# Patient Record
Sex: Female | Born: 1945 | Race: Black or African American | Hispanic: No | Marital: Married
Health system: Southern US, Community
[De-identification: ages and names within clinical notes are randomized; demographics above are authoritative.]

## PROBLEM LIST (undated history)

## (undated) DIAGNOSIS — E119 Type 2 diabetes mellitus without complications: Secondary | ICD-10-CM

## (undated) DIAGNOSIS — I10 Essential (primary) hypertension: Secondary | ICD-10-CM

---

## 1999-03-02 ENCOUNTER — Other Ambulatory Visit: Admission: RE | Admit: 1999-03-02 | Discharge: 1999-03-02 | Payer: Self-pay | Admitting: Obstetrics and Gynecology

## 2000-08-10 ENCOUNTER — Other Ambulatory Visit: Admission: RE | Admit: 2000-08-10 | Discharge: 2000-08-10 | Payer: Self-pay | Admitting: Family Medicine

## 2006-05-07 ENCOUNTER — Emergency Department (HOSPITAL_COMMUNITY): Admission: EM | Admit: 2006-05-07 | Discharge: 2006-05-07 | Payer: Self-pay | Admitting: Emergency Medicine

## 2008-12-10 HISTORY — PX: COLONOSCOPY: SHX174

## 2009-12-02 ENCOUNTER — Inpatient Hospital Stay (HOSPITAL_COMMUNITY)
Admission: EM | Admit: 2009-12-02 | Discharge: 2009-12-04 | Payer: Self-pay | Source: Home / Self Care | Admitting: Emergency Medicine

## 2010-02-10 ENCOUNTER — Other Ambulatory Visit (HOSPITAL_COMMUNITY): Payer: Self-pay

## 2010-03-15 ENCOUNTER — Other Ambulatory Visit (HOSPITAL_COMMUNITY): Payer: Self-pay

## 2010-03-23 LAB — CBC
HCT: 26.7 % — ABNORMAL LOW (ref 36.0–46.0)
HCT: 27 % — ABNORMAL LOW (ref 36.0–46.0)
HCT: 28.8 % — ABNORMAL LOW (ref 36.0–46.0)
Hemoglobin: 8.4 g/dL — ABNORMAL LOW (ref 12.0–15.0)
Hemoglobin: 8.7 g/dL — ABNORMAL LOW (ref 12.0–15.0)
Hemoglobin: 8.7 g/dL — ABNORMAL LOW (ref 12.0–15.0)
MCH: 25.4 pg — ABNORMAL LOW (ref 26.0–34.0)
MCH: 25.8 pg — ABNORMAL LOW (ref 26.0–34.0)
MCH: 26 pg (ref 26.0–34.0)
MCH: 26 pg (ref 26.0–34.0)
MCH: 26.1 pg (ref 26.0–34.0)
MCH: 26.3 pg (ref 26.0–34.0)
MCHC: 32.6 g/dL (ref 30.0–36.0)
MCHC: 33.1 g/dL (ref 30.0–36.0)
MCHC: 33.1 g/dL (ref 30.0–36.0)
MCV: 78.7 fL (ref 78.0–100.0)
MCV: 78.8 fL (ref 78.0–100.0)
MCV: 78.8 fL (ref 78.0–100.0)
Platelets: 176 10*3/uL (ref 150–400)
Platelets: 188 10*3/uL (ref 150–400)
Platelets: 190 10*3/uL (ref 150–400)
Platelets: 201 10*3/uL (ref 150–400)
RBC: 3.22 MIL/uL — ABNORMAL LOW (ref 3.87–5.11)
RBC: 3.42 MIL/uL — ABNORMAL LOW (ref 3.87–5.11)
RDW: 15.2 % (ref 11.5–15.5)
RDW: 15.6 % — ABNORMAL HIGH (ref 11.5–15.5)
RDW: 15.6 % — ABNORMAL HIGH (ref 11.5–15.5)
RDW: 15.7 % — ABNORMAL HIGH (ref 11.5–15.5)
RDW: 15.7 % — ABNORMAL HIGH (ref 11.5–15.5)
RDW: 15.9 % — ABNORMAL HIGH (ref 11.5–15.5)
WBC: 6.8 10*3/uL (ref 4.0–10.5)
WBC: 7.1 10*3/uL (ref 4.0–10.5)
WBC: 9.1 10*3/uL (ref 4.0–10.5)
WBC: 9.6 10*3/uL (ref 4.0–10.5)

## 2010-03-23 LAB — GLUCOSE, CAPILLARY
Glucose-Capillary: 114 mg/dL — ABNORMAL HIGH (ref 70–99)
Glucose-Capillary: 128 mg/dL — ABNORMAL HIGH (ref 70–99)
Glucose-Capillary: 136 mg/dL — ABNORMAL HIGH (ref 70–99)
Glucose-Capillary: 138 mg/dL — ABNORMAL HIGH (ref 70–99)
Glucose-Capillary: 138 mg/dL — ABNORMAL HIGH (ref 70–99)
Glucose-Capillary: 90 mg/dL (ref 70–99)

## 2010-03-23 LAB — COMPREHENSIVE METABOLIC PANEL
ALT: 17 U/L (ref 0–35)
AST: 18 U/L (ref 0–37)
Albumin: 3.1 g/dL — ABNORMAL LOW (ref 3.5–5.2)
Alkaline Phosphatase: 55 U/L (ref 39–117)
Alkaline Phosphatase: 69 U/L (ref 39–117)
BUN: 9 mg/dL (ref 6–23)
Chloride: 110 mEq/L (ref 96–112)
GFR calc non Af Amer: 41 mL/min — ABNORMAL LOW (ref >60)
GFR calc non Af Amer: 53 mL/min — ABNORMAL LOW (ref >60)
Glucose, Bld: 125 mg/dL — ABNORMAL HIGH (ref 70–99)
Glucose, Bld: 227 mg/dL — ABNORMAL HIGH (ref 70–99)
Potassium: 4 mEq/L (ref 3.5–5.1)
Potassium: 4 mEq/L (ref 3.5–5.1)
Sodium: 139 mEq/L (ref 135–145)
Total Bilirubin: 0.4 mg/dL (ref 0.3–1.2)
Total Protein: 5.7 g/dL — ABNORMAL LOW (ref 6.0–8.3)

## 2010-03-23 LAB — DIFFERENTIAL
Basophils Absolute: 0 10*3/uL (ref 0.0–0.1)
Basophils Absolute: 0 10*3/uL (ref 0.0–0.1)
Basophils Absolute: 0 10*3/uL (ref 0.0–0.1)
Basophils Absolute: 0.1 10*3/uL (ref 0.0–0.1)
Basophils Relative: 0 % (ref 0–1)
Basophils Relative: 1 % (ref 0–1)
Basophils Relative: 1 % (ref 0–1)
Eosinophils Absolute: 0.1 10*3/uL (ref 0.0–0.7)
Eosinophils Absolute: 0.1 10*3/uL (ref 0.0–0.7)
Eosinophils Absolute: 0.1 10*3/uL (ref 0.0–0.7)
Eosinophils Absolute: 0.2 10*3/uL (ref 0.0–0.7)
Eosinophils Relative: 2 % (ref 0–5)
Lymphocytes Relative: 37 % (ref 12–46)
Lymphocytes Relative: 40 % (ref 12–46)
Lymphs Abs: 2.5 10*3/uL (ref 0.7–4.0)
Lymphs Abs: 2.7 10*3/uL (ref 0.7–4.0)
Lymphs Abs: 3.3 10*3/uL (ref 0.7–4.0)
Monocytes Absolute: 0.4 10*3/uL (ref 0.1–1.0)
Monocytes Relative: 6 % (ref 3–12)
Monocytes Relative: 8 % (ref 3–12)
Neutro Abs: 3.1 10*3/uL (ref 1.7–7.7)
Neutro Abs: 3.3 10*3/uL (ref 1.7–7.7)
Neutro Abs: 5 10*3/uL (ref 1.7–7.7)
Neutrophils Relative %: 46 % (ref 43–77)
Neutrophils Relative %: 47 % (ref 43–77)
Neutrophils Relative %: 49 % (ref 43–77)
Neutrophils Relative %: 52 % (ref 43–77)

## 2010-03-23 LAB — URINE CULTURE

## 2010-03-23 LAB — ABO/RH: ABO/RH(D): O POS

## 2010-03-23 LAB — APTT: aPTT: 21 seconds — ABNORMAL LOW (ref 24–37)

## 2010-03-23 LAB — LACTIC ACID, PLASMA: Lactic Acid, Venous: 3 mmol/L — ABNORMAL HIGH (ref 0.5–2.2)

## 2010-03-23 LAB — PROTIME-INR
INR: 1.06 (ref 0.00–1.49)
Prothrombin Time: 14 seconds (ref 11.6–15.2)

## 2010-03-23 LAB — TYPE AND SCREEN: Unit division: 0

## 2010-03-23 LAB — URINALYSIS, ROUTINE W REFLEX MICROSCOPIC
Specific Gravity, Urine: 1.028 (ref 1.005–1.030)
Urobilinogen, UA: 0.2 mg/dL (ref 0.0–1.0)
pH: 5 (ref 5.0–8.0)

## 2010-03-23 LAB — LIPASE, BLOOD: Lipase: 19 U/L (ref 11–59)

## 2010-03-23 LAB — URINE MICROSCOPIC-ADD ON

## 2010-03-23 LAB — CARDIAC PANEL(CRET KIN+CKTOT+MB+TROPI): Troponin I: 0.02 ng/mL (ref 0.00–0.06)

## 2010-03-23 LAB — POCT CARDIAC MARKERS: Myoglobin, poc: 41.7 ng/mL (ref 12–200)

## 2010-04-13 ENCOUNTER — Other Ambulatory Visit (HOSPITAL_COMMUNITY): Payer: Self-pay

## 2010-04-15 ENCOUNTER — Other Ambulatory Visit: Payer: Self-pay | Admitting: Gastroenterology

## 2010-04-15 ENCOUNTER — Ambulatory Visit (HOSPITAL_COMMUNITY)
Admission: RE | Admit: 2010-04-15 | Discharge: 2010-04-15 | Disposition: A | Discharge status: Home / Self Care | Payer: PRIVATE HEALTH INSURANCE | Source: Ambulatory Visit | Attending: Gastroenterology | Admitting: Gastroenterology

## 2010-04-15 DIAGNOSIS — K648 Other hemorrhoids: Secondary | ICD-10-CM | POA: Insufficient documentation

## 2010-04-15 DIAGNOSIS — Z79899 Other long term (current) drug therapy: Secondary | ICD-10-CM | POA: Insufficient documentation

## 2010-04-15 DIAGNOSIS — K269 Duodenal ulcer, unspecified as acute or chronic, without hemorrhage or perforation: Secondary | ICD-10-CM | POA: Insufficient documentation

## 2010-04-15 DIAGNOSIS — K620 Anal polyp: Secondary | ICD-10-CM | POA: Insufficient documentation

## 2010-04-15 DIAGNOSIS — I10 Essential (primary) hypertension: Secondary | ICD-10-CM | POA: Insufficient documentation

## 2010-04-15 DIAGNOSIS — K315 Obstruction of duodenum: Secondary | ICD-10-CM | POA: Insufficient documentation

## 2010-04-15 DIAGNOSIS — K644 Residual hemorrhoidal skin tags: Secondary | ICD-10-CM | POA: Insufficient documentation

## 2010-04-15 DIAGNOSIS — D126 Benign neoplasm of colon, unspecified: Secondary | ICD-10-CM | POA: Insufficient documentation

## 2010-04-15 DIAGNOSIS — E109 Type 1 diabetes mellitus without complications: Secondary | ICD-10-CM | POA: Insufficient documentation

## 2010-04-15 DIAGNOSIS — D649 Anemia, unspecified: Secondary | ICD-10-CM | POA: Insufficient documentation

## 2010-04-15 DIAGNOSIS — K208 Other esophagitis without bleeding: Secondary | ICD-10-CM | POA: Insufficient documentation

## 2010-04-15 DIAGNOSIS — K621 Rectal polyp: Secondary | ICD-10-CM | POA: Insufficient documentation

## 2010-04-26 NOTE — Op Note (Signed)
NAME:  Madison Edwards, Madison Edwards NO.:  [id number]  MEDICAL RECORD NO.:  [id number]           PATIENT TYPE:  O  LOCATION:  WLEN                         FACILITY:  [facility]  PHYSICIAN:  Petra Kuba, M.D.    DATE OF BIRTH:  01/17/1945  DATE OF PROCEDURE:  04/15/2010 DATE OF DISCHARGE:                              OPERATIVE REPORT   PROCEDURE:  Esophagogastroduodenoscopy.  INDICATION:  Upper tract symptoms, anemia.  Consent was signed after risks, benefits, methods, options thoroughly discussed multiple times in the past.  MEDICINES USED:  Per Anesthesia, 80 mg Diprivan, 50 mcg of fentanyl, 2 mg of Versed.  PROCEDURE:  Video endoscope was inserted by direct vision.  Proximal and mid-esophagus was normal.  She may have had some very minimal distal esophagitis, but no obvious significant hiatal hernia.  Scope passed into the stomach, advanced through a normal antrum, normal pylorus into the duodenal bulb where a well-healed small ulcer was seen without a white base which just looked like a residual diverticula.  Scope passed around the C-loop to a normal second portion of the duodenum.  A quick look at the ampulla was normal.  Scope was slowly withdrawn back to the bulb and a good look confirmed the above findings.  Scope was withdrawn back to the stomach.  The cardia and the fundus were normal.  She had some difficulty holding air, so complete retroflexion view was difficult, but straight visualization of the stomach did not reveal any additional findings.  Air was suctioned.  Scope slowly withdrawn. Again, a good look at the esophagus confirmed a very minimal distal reflux esophagitis, but no other abnormalities.  Scope was removed.  The patient tolerated the procedure well.  There was no obvious immediate complication.  ENDOSCOPIC DIAGNOSES: 1. Scar from previous duodenal bulb ulcer. 2. Otherwise normal EGD except for very minimal distal esophagitis.  PLAN:  Continue  pump inhibitors.  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          ______________________________ Petra Kuba, M.D.     MEM/MEDQ  D:  04/15/2010  T:  04/16/2010  Job:  [id number]  cc:   Larina Earthly, M.D. Fax: [phone]  Electronically Signed by Vida Rigger M.D. on 04/26/2010 12:47:07 PM

## 2010-04-26 NOTE — Op Note (Signed)
NAME:  Madison Edwards, Madison Edwards NO.:  [id number]  MEDICAL RECORD NO.:  [id number]           PATIENT TYPE:  O  LOCATION:  WLEN                         FACILITY:  [facility]  PHYSICIAN:  Petra Kuba, M.D.    DATE OF BIRTH:  [birth year]  DATE OF PROCEDURE:  04/15/2010 DATE OF DISCHARGE:                              OPERATIVE REPORT   PROCEDURE:  Colonoscopy with polypectomy and biopsy.  INDICATION:  The patient with large polyp with high-grade dysplasia, want to make sure all was removed in a patient with chronic anemia. Consent was signed after risks, benefits, methods, options thoroughly discussed multiple times in the past.  ADDITIONAL MEDICINES:  For this procedure, 400 mg of Diprivan only.  PROCEDURE:  Rectal inspection was pertinent for external hemorrhoids, small.  Digital exam was negative.  The video colonoscope was inserted and easily advanced around the colon to the cecum, to advance into the cecal pole required some abdominal pressure.  On insertion, the [place] ink injection site was confirmed without obvious significant mass seen on insertion and no other worrisome abnormalities seen on insertion. The cecum was identified by the appendiceal orifice and the ileocecal valve.  The prep was adequate.  There was some liquid stool that required washing and suctioning.  The scope was slowly withdrawn.  In the proximal ascending, a small polyp was seen and to get it in proper position for polypectomy, we did have to roll her onto her back and the polyp was snared, electrocautery applied, and the polyp was suctioned through the scope and collected in the trap.  The scope was then further withdrawn.  No other abnormalities were seen as we slowly withdrew back to the splenic flexure where the [place] ink was seen.  Along the cautery margin next to the [place] ink, possibly a tiny amount of residual polyp was seen and multiple cold biopsies were done of the area and put in  a separate container.  No other obvious residual abnormality was seen.  We went ahead and slowly withdrew.  The descending and proximal sigmoid was normal.  In the distal sigmoid and rectum, a few hyperplastic-appearing polyps were seen and were hot biopsied and put in a 3rd container. Anorectal pull-through and retroflexion confirmed some small hemorrhoids.  Scope was straightened and readvanced a short ways up the left side of the colon back to the [place] ink area and again this area was one more time reevaluated without additional findings.  The air was suctioned.  Scope withdrawn.  The patient tolerated this procedure well. There was no obvious immediate complication.  ENDOSCOPIC DIAGNOSES: 1. Internal and external small hemorrhoids. 2. A few tiny rectal and distal sigmoid hyperplastic-appearing polyps,     hot biopsied. 3. [place] ink at the proximal level of splenic flexure with a     questionable very minimal residual polyp along the cautery margin     status post biopsy. 4. Ascending small polyp, snared. 5. Otherwise, within normal limits to the cecum.  PLAN:  Await pathology, but probably repeat colon screening in 1-3 years unless worrisome finding.  Continue present management.  Continue  iron. Continue pump inhibitors and follow up p.r.n. or in 2 months to recheck symptoms, possibly guaiacs and CBC and make sure no further workup plans are needed.          ______________________________ Petra Kuba, M.D.     MEM/MEDQ  D:  04/15/2010  T:  04/16/2010  Job:  [id number]  cc:   Larina Earthly, M.D. Fax: [phone]  Electronically Signed by Vida Rigger M.D. on 04/26/2010 12:47:10 PM

## 2012-02-22 ENCOUNTER — Ambulatory Visit: Payer: PRIVATE HEALTH INSURANCE | Admitting: Rehabilitative and Restorative Service Providers"

## 2012-02-24 ENCOUNTER — Ambulatory Visit: Payer: PRIVATE HEALTH INSURANCE | Attending: Internal Medicine | Admitting: Physical Therapy

## 2013-05-27 ENCOUNTER — Other Ambulatory Visit: Payer: Self-pay | Admitting: Internal Medicine

## 2013-05-27 DIAGNOSIS — Z1231 Encounter for screening mammogram for malignant neoplasm of breast: Secondary | ICD-10-CM

## 2013-06-06 ENCOUNTER — Inpatient Hospital Stay: Admission: RE | Admit: 2013-06-06 | Payer: PRIVATE HEALTH INSURANCE | Source: Ambulatory Visit

## 2014-04-19 ENCOUNTER — Emergency Department (HOSPITAL_COMMUNITY): Payer: Medicare Other

## 2014-04-19 ENCOUNTER — Encounter (HOSPITAL_COMMUNITY): Payer: Self-pay | Admitting: Emergency Medicine

## 2014-04-19 ENCOUNTER — Emergency Department (HOSPITAL_COMMUNITY)
Admission: EM | Admit: 2014-04-19 | Discharge: 2014-04-19 | Disposition: A | Discharge status: Home / Self Care | Payer: Medicare Other | Attending: Emergency Medicine | Admitting: Emergency Medicine

## 2014-04-19 DIAGNOSIS — I10 Essential (primary) hypertension: Secondary | ICD-10-CM | POA: Insufficient documentation

## 2014-04-19 DIAGNOSIS — R1084 Generalized abdominal pain: Secondary | ICD-10-CM | POA: Diagnosis not present

## 2014-04-19 DIAGNOSIS — E119 Type 2 diabetes mellitus without complications: Secondary | ICD-10-CM | POA: Insufficient documentation

## 2014-04-19 DIAGNOSIS — R109 Unspecified abdominal pain: Secondary | ICD-10-CM

## 2014-04-19 DIAGNOSIS — R112 Nausea with vomiting, unspecified: Secondary | ICD-10-CM | POA: Diagnosis not present

## 2014-04-19 HISTORY — DX: Essential (primary) hypertension: I10

## 2014-04-19 HISTORY — DX: Type 2 diabetes mellitus without complications: E11.9

## 2014-04-19 LAB — COMPREHENSIVE METABOLIC PANEL
ALBUMIN: 4.1 g/dL (ref 3.5–5.2)
ALT: 21 U/L (ref 0–35)
ANION GAP: 11 (ref 5–15)
AST: 20 U/L (ref 0–37)
Alkaline Phosphatase: 99 U/L (ref 39–117)
BUN: 22 mg/dL (ref 6–23)
CHLORIDE: 99 mmol/L (ref 96–112)
CO2: 26 mmol/L (ref 19–32)
CREATININE: 1.23 mg/dL — AB (ref 0.50–1.10)
Calcium: 9.7 mg/dL (ref 8.4–10.5)
GFR calc non Af Amer: 44 mL/min — ABNORMAL LOW (ref >90)
GFR, EST AFRICAN AMERICAN: 51 mL/min — AB (ref >90)
Glucose, Bld: 196 mg/dL — ABNORMAL HIGH (ref 70–99)
POTASSIUM: 4.1 mmol/L (ref 3.5–5.1)
Sodium: 136 mmol/L (ref 135–145)
Total Bilirubin: 0.7 mg/dL (ref 0.3–1.2)
Total Protein: 7.8 g/dL (ref 6.0–8.3)

## 2014-04-19 LAB — CBC WITH DIFFERENTIAL/PLATELET
Basophils Absolute: 0.1 10*3/uL (ref 0.0–0.1)
Basophils Relative: 1 % (ref 0–1)
EOS ABS: 0.1 10*3/uL (ref 0.0–0.7)
Eosinophils Relative: 2 % (ref 0–5)
HEMATOCRIT: 41.5 % (ref 36.0–46.0)
HEMOGLOBIN: 12.9 g/dL (ref 12.0–15.0)
LYMPHS ABS: 2.7 10*3/uL (ref 0.7–4.0)
Lymphocytes Relative: 40 % (ref 12–46)
MCH: 24.6 pg — AB (ref 26.0–34.0)
MCHC: 31.1 g/dL (ref 30.0–36.0)
MCV: 79 fL (ref 78.0–100.0)
MONOS PCT: 7 % (ref 3–12)
Monocytes Absolute: 0.5 10*3/uL (ref 0.1–1.0)
Neutro Abs: 3.3 10*3/uL (ref 1.7–7.7)
Neutrophils Relative %: 50 % (ref 43–77)
PLATELETS: 253 10*3/uL (ref 150–400)
RBC: 5.25 MIL/uL — ABNORMAL HIGH (ref 3.87–5.11)
RDW: 15 % (ref 11.5–15.5)
WBC: 6.7 10*3/uL (ref 4.0–10.5)

## 2014-04-19 LAB — URINALYSIS, ROUTINE W REFLEX MICROSCOPIC
Bilirubin Urine: NEGATIVE
Glucose, UA: NEGATIVE mg/dL
Hgb urine dipstick: NEGATIVE
KETONES UR: NEGATIVE mg/dL
Nitrite: NEGATIVE
PROTEIN: 100 mg/dL — AB
Specific Gravity, Urine: 1.024 (ref 1.005–1.030)
Urobilinogen, UA: 0.2 mg/dL (ref 0.0–1.0)
pH: 5 (ref 5.0–8.0)

## 2014-04-19 LAB — LIPASE, BLOOD: Lipase: 17 U/L (ref 11–59)

## 2014-04-19 LAB — URINE MICROSCOPIC-ADD ON

## 2014-04-19 MED ORDER — IOHEXOL 300 MG/ML  SOLN
100.0000 mL | Freq: Once | INTRAMUSCULAR | Status: AC | PRN
  Administered 2014-04-19: 100 mL via INTRAVENOUS

## 2014-04-19 MED ORDER — IOHEXOL 300 MG/ML  SOLN: 50.0000 mL | Freq: Once | INTRAMUSCULAR | Status: DC | PRN

## 2014-04-19 MED ORDER — IOHEXOL 300 MG/ML  SOLN
50.0000 mL | Freq: Once | INTRAMUSCULAR | Status: AC | PRN
  Administered 2014-04-19: 50 mL via ORAL

## 2014-04-19 MED ORDER — MORPHINE SULFATE 4 MG/ML IJ SOLN
4.0000 mg | Freq: Once | INTRAMUSCULAR | Status: AC
  Administered 2014-04-19: 4 mg via INTRAVENOUS
  Filled 2014-04-19: qty 1

## 2014-04-19 MED ORDER — ONDANSETRON HCL 4 MG/2ML IJ SOLN
4.0000 mg | Freq: Once | INTRAMUSCULAR | Status: AC
  Administered 2014-04-19: 4 mg via INTRAVENOUS
  Filled 2014-04-19: qty 2

## 2014-04-19 NOTE — ED Notes (Signed)
Pt c/o abdominal pain and emesis onset Tuesday night. Denies diarrhea.

## 2014-04-19 NOTE — Discharge Instructions (Signed)
Abdominal Pain °Many things can cause abdominal pain. Usually, abdominal pain is not caused by a disease and will improve without treatment. It can often be observed and treated at home. Your health care provider will do a physical exam and possibly order blood tests and X-rays to help determine the seriousness of your pain. However, in many cases, more time must pass before a clear cause of the pain can be found. Before that point, your health care provider may not know if you need more testing or further treatment. °HOME CARE INSTRUCTIONS  °Monitor your abdominal pain for any changes. The following actions may help to alleviate any discomfort you are experiencing: °· Only take over-the-counter or prescription medicines as directed by your health care provider. °· Do not take laxatives unless directed to do so by your health care provider. °· Try a clear liquid diet (broth, tea, or water) as directed by your health care provider. Slowly move to a bland diet as tolerated. °SEEK MEDICAL CARE IF: °· You have unexplained abdominal pain. °· You have abdominal pain associated with nausea or diarrhea. °· You have pain when you urinate or have a bowel movement. °· You experience abdominal pain that wakes you in the night. °· You have abdominal pain that is worsened or improved by eating food. °· You have abdominal pain that is worsened with eating fatty foods. °· You have a fever. °SEEK IMMEDIATE MEDICAL CARE IF:  °· Your pain does not go away within 2 hours. °· You keep throwing up (vomiting). °· Your pain is felt only in portions of the abdomen, such as the right side or the left lower portion of the abdomen. °· You pass bloody or black tarry stools. °MAKE SURE YOU: °· Understand these instructions.   °· Will watch your condition.   °· Will get help right away if you are not doing well or get worse.   °Document Released: 10/06/2004 Document Revised: 01/01/2013 Document Reviewed: 09/05/2012 °[employer]® Patient Information  ©2015 [employer]. This information is not intended to replace advice given to you by your health care provider. Make sure you discuss any questions you have with your health care provider. ° °Abdominal Pain, Women °Abdominal (stomach, pelvic, or belly) pain can be caused by many things. It is important to tell your doctor: °· The location of the pain. °· Does it come and go or is it present all the time? °· Are there things that start the pain (eating certain foods, exercise)? °· Are there other symptoms associated with the pain (fever, nausea, vomiting, diarrhea)? °All of this is helpful to know when trying to find the cause of the pain. °CAUSES  °· Stomach: virus or bacteria infection, or ulcer. °· Intestine: appendicitis (inflamed appendix), regional ileitis (Crohn's disease), ulcerative colitis (inflamed colon), irritable bowel syndrome, diverticulitis (inflamed diverticulum of the colon), or cancer of the stomach or intestine. °· Gallbladder disease or stones in the gallbladder. °· Kidney disease, kidney stones, or infection. °· Pancreas infection or cancer. °· Fibromyalgia (pain disorder). °· Diseases of the female organs: °¨ Uterus: fibroid (non-cancerous) tumors or infection. °¨ Fallopian tubes: infection or tubal pregnancy. °¨ Ovary: cysts or tumors. °¨ Pelvic adhesions (scar tissue). °¨ Endometriosis (uterus lining tissue growing in the pelvis and on the pelvic organs). °¨ Pelvic congestion syndrome (female organs filling up with blood just before the menstrual period). °¨ Pain with the menstrual period. °¨ Pain with ovulation (producing an egg). °¨ Pain with an IUD (intrauterine device, birth control) in the uterus. °¨   Cancer of the female organs. °· Functional pain (pain not caused by a disease, may improve without treatment). °· Psychological pain. °· Depression. °DIAGNOSIS  °Your doctor will decide the seriousness of your pain by doing an examination. °· Blood tests. °· X-rays. °· Ultrasound. °· CT  scan (computed tomography, special type of X-ray). °· MRI (magnetic resonance imaging). °· Cultures, for infection. °· Barium enema (dye inserted in the large intestine, to better view it with X-rays). °· Colonoscopy (looking in intestine with a lighted tube). °· Laparoscopy (minor surgery, looking in abdomen with a lighted tube). °· Major abdominal exploratory surgery (looking in abdomen with a large incision). °TREATMENT  °The treatment will depend on the cause of the pain.  °· Many cases can be observed and treated at home. °· Over-the-counter medicines recommended by your caregiver. °· Prescription medicine. °· Antibiotics, for infection. °· Birth control pills, for painful periods or for ovulation pain. °· Hormone treatment, for endometriosis. °· Nerve blocking injections. °· Physical therapy. °· Antidepressants. °· Counseling with a psychologist or psychiatrist. °· Minor or major surgery. °HOME CARE INSTRUCTIONS  °· Do not take laxatives, unless directed by your caregiver. °· Take over-the-counter pain medicine only if ordered by your caregiver. Do not take aspirin because it can cause an upset stomach or bleeding. °· Try a clear liquid diet (broth or water) as ordered by your caregiver. Slowly move to a bland diet, as tolerated, if the pain is related to the stomach or intestine. °· Have a thermometer and take your temperature several times a day, and record it. °· Bed rest and sleep, if it helps the pain. °· Avoid sexual intercourse, if it causes pain. °· Avoid stressful situations. °· Keep your follow-up appointments and tests, as your caregiver orders. °· If the pain does not go away with medicine or surgery, you may try: °¨ Acupuncture. °¨ Relaxation exercises (yoga, meditation). °¨ Group therapy. °¨ Counseling. °SEEK MEDICAL CARE IF:  °· You notice certain foods cause stomach pain. °· Your home care treatment is not helping your pain. °· You need stronger pain medicine. °· You want your IUD  removed. °· You feel faint or lightheaded. °· You develop nausea and vomiting. °· You develop a rash. °· You are having side effects or an allergy to your medicine. °SEEK IMMEDIATE MEDICAL CARE IF:  °· Your pain does not go away or gets worse. °· You have a fever. °· Your pain is felt only in portions of the abdomen. The right side could possibly be appendicitis. The left lower portion of the abdomen could be colitis or diverticulitis. °· You are passing blood in your stools (bright red or black tarry stools, with or without vomiting). °· You have blood in your urine. °· You develop chills, with or without a fever. °· You pass out. °MAKE SURE YOU:  °· Understand these instructions. °· Will watch your condition. °· Will get help right away if you are not doing well or get worse. °Document Released: 10/24/2006 Document Revised: 05/13/2013 Document Reviewed: 11/13/2008 °ExitCare® Patient Information ©2015 [employer]. This information is not intended to replace advice given to you by your health care provider. Make sure you discuss any questions you have with your health care provider. ° °

## 2014-04-19 NOTE — ED Provider Notes (Signed)
CSN: [id number]     Arrival date & time 04/19/14  1741 History   First MD Initiated Contact with Patient 04/19/14 1755     Chief Complaint  Patient presents with  . Abdominal Pain     (Consider location/radiation/quality/duration/timing/severity/associated sxs/prior Treatment) Patient is a 69 y.o. female presenting with abdominal pain. The history is provided by the patient.  Abdominal Pain Pain location:  Generalized Pain quality: aching   Pain radiates to:  Does not radiate Pain severity:  Mild Onset quality:  Gradual Duration:  4 days Timing:  Intermittent Progression:  Worsening Chronicity:  Recurrent Worsened by:  Nothing tried Ineffective treatments:  None tried Associated symptoms: nausea and vomiting   Associated symptoms: no chills, no cough, no diarrhea, no fever, no hematuria, no vaginal bleeding and no vaginal discharge     Past Medical History  Diagnosis Date  . Diabetes mellitus without complication   . Hypertension    History reviewed. No pertinent past surgical history. History reviewed. No pertinent family history. History  Substance Use Topics  . Smoking status: Not on file  . Smokeless tobacco: Not on file  . Alcohol Use: Not on file   OB History    No data available     Review of Systems  Constitutional: Negative for fever and chills.  Respiratory: Negative for cough.   Gastrointestinal: Positive for nausea, vomiting and abdominal pain. Negative for diarrhea.  Genitourinary: Negative for hematuria, vaginal bleeding and vaginal discharge.  All other systems reviewed and are negative.     Allergies  Review of patient's allergies indicates no known allergies.  Home Medications   Prior to Admission medications   Not on File   BP 182/76 mmHg  Pulse 80  Temp(Src) 98.5 F (36.9 C) (Oral)  Resp 18  SpO2 100% Physical Exam  Constitutional: She is oriented to person, place, and time. She appears well-developed and well-nourished.   Non-toxic appearance. No distress.  HENT:  Head: Normocephalic and atraumatic.  Eyes: Conjunctivae, EOM and lids are normal. Pupils are equal, round, and reactive to light.  Neck: Normal range of motion. Neck supple. No tracheal deviation present. No thyroid mass present.  Cardiovascular: Normal rate, regular rhythm and normal heart sounds.  Exam reveals no gallop.   No murmur heard. Pulmonary/Chest: Effort normal and breath sounds normal. No stridor. No respiratory distress. She has no decreased breath sounds. She has no wheezes. She has no rhonchi. She has no rales.  Abdominal: Soft. Normal appearance and bowel sounds are normal. She exhibits no distension. There is generalized tenderness. There is no rigidity, no rebound, no guarding and no CVA tenderness.  Musculoskeletal: Normal range of motion. She exhibits no edema or tenderness.  Neurological: She is alert and oriented to person, place, and time. She has normal strength. No cranial nerve deficit or sensory deficit. GCS eye subscore is 4. GCS verbal subscore is 5. GCS motor subscore is 6.  Skin: Skin is warm and dry. No abrasion and no rash noted.  Psychiatric: She has a normal mood and affect. Her speech is normal and behavior is normal.  Nursing note and vitals reviewed.   ED Course  Procedures (including critical care time) Labs Review Labs Reviewed  CBC WITH DIFFERENTIAL/PLATELET  COMPREHENSIVE METABOLIC PANEL  LIPASE, BLOOD  URINALYSIS, ROUTINE W REFLEX MICROSCOPIC    Imaging Review No results found.   EKG Interpretation None      MDM   Final diagnoses:  Abdominal pain    She given  IV fluids and pain meds and feels better. Abdominal CT shows no acute process. Repeat abdominal exam at time of discharge remains nonsurgical. Patient stable for discharge    Lacretia Leigh, MD 04/19/14 2045

## 2015-12-01 ENCOUNTER — Telehealth: Payer: Self-pay

## 2015-12-01 NOTE — Telephone Encounter (Signed)
wrong patient

## 2015-12-01 NOTE — Telephone Encounter (Deleted)
Patient called back to inquire about getting RX for insulin needles. Explained that MD said that she should not be taking insulin due to hypoglycemia needs to come in for evaluation or go to ED. Patient stated she would buy her own needles and she will find her a new doctor. Encouraged patient to come in for appointment or go to ED for eval she no.

## 2016-06-01 LAB — GLUCOSE, POCT (MANUAL RESULT ENTRY): POC Glucose: 209 mg/dl — AB (ref 70–99)

## 2016-10-12 IMAGING — CT CT ABD-PELV W/ CM
1 of 3 series · 14 of 32 positions shown, 19 images · IV contrast (OMNIPAQUE 300)
Comparison: 12/02/2009

CLINICAL DATA: Abdominal pain and emesis.

EXAM:
CT ABDOMEN AND PELVIS WITH CONTRAST
TECHNIQUE: Multidetector CT imaging of the abdomen and pelvis was performed
using the standard protocol following bolus administration of
intravenous contrast.
CONTRAST:  50mL OMNIPAQUE IOHEXOL 300 MG/ML SOLN, 100mL OMNIPAQUE
IOHEXOL 300 MG/ML SOLN

[Series 2: abd/pel with · axial · 0.98mm/px · z∈[+1439,+1839]mm · 14 of 90 slices shown, 19 images]
[im 5/90  soft-tissue]
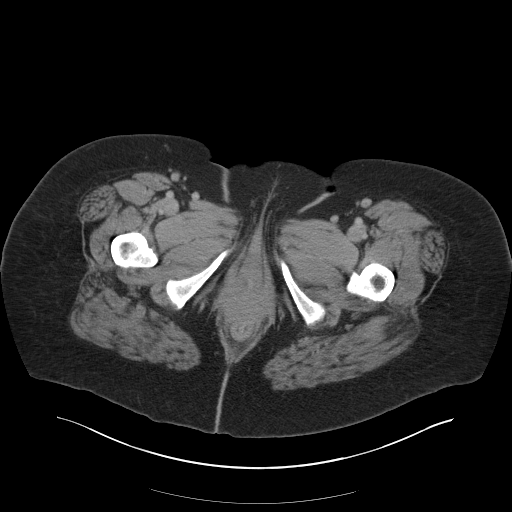
[im 5/90  bone]
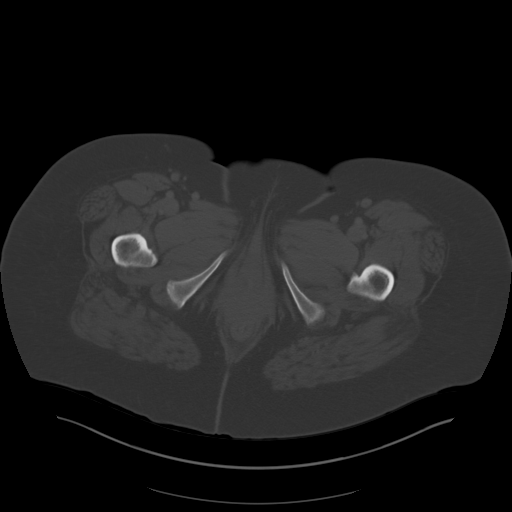
[im 15/90  soft-tissue]
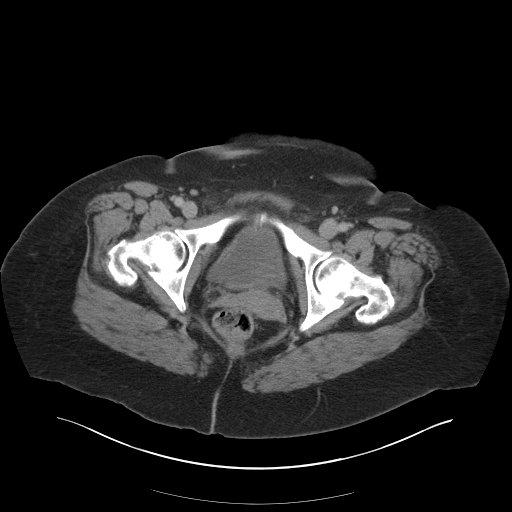
[im 19/90  soft-tissue]
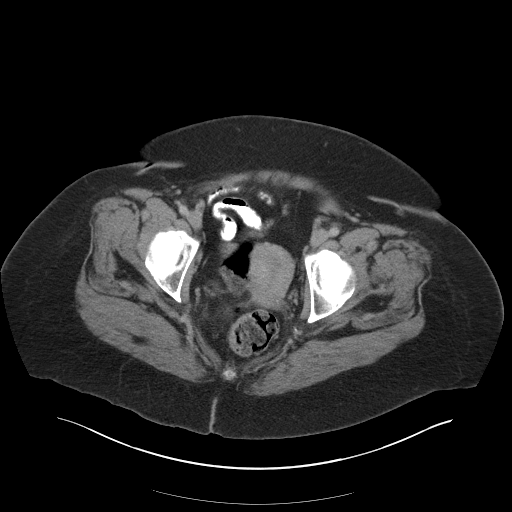
[im 24/90  soft-tissue]
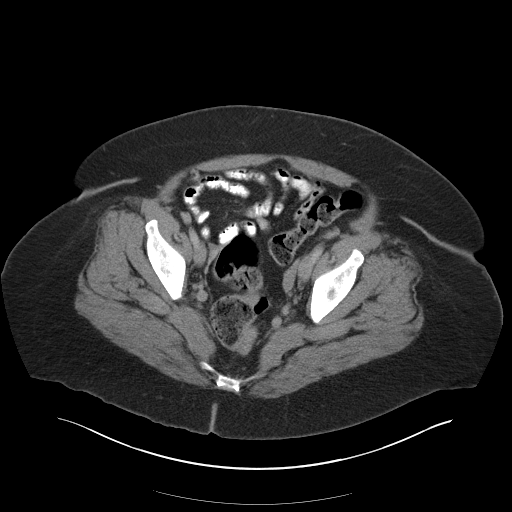
[im 33/90  soft-tissue]
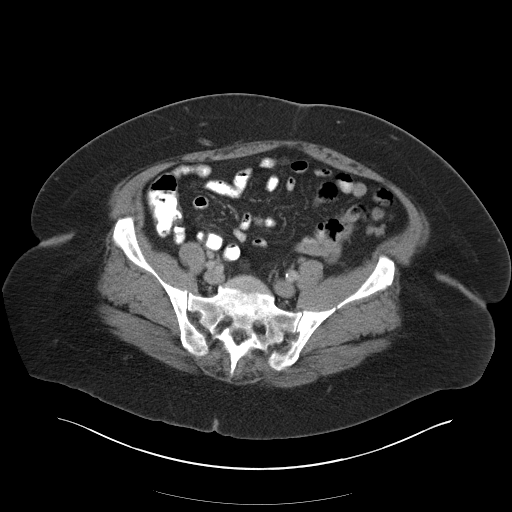
[im 38/90  soft-tissue]
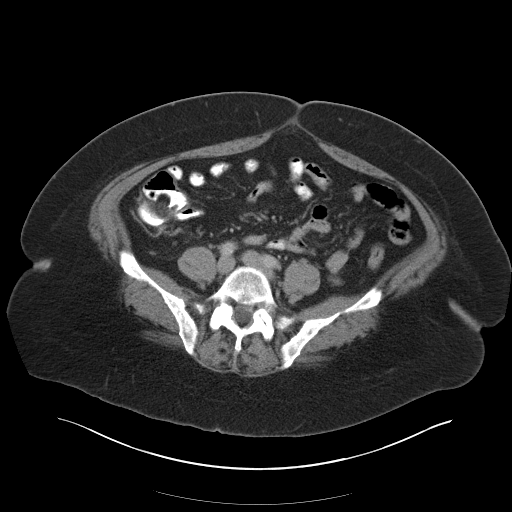
[im 47/90  soft-tissue]
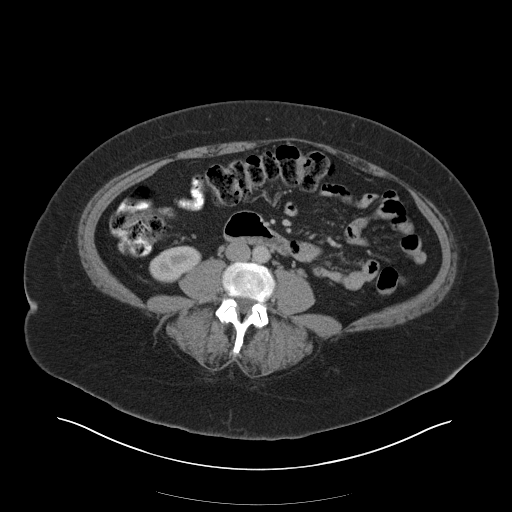
[im 52/90  soft-tissue]
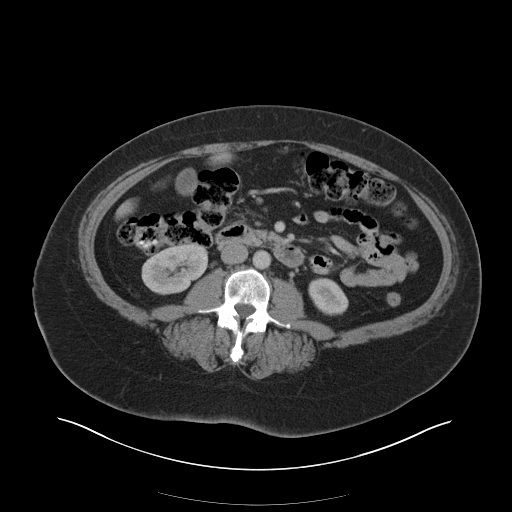
[im 57/90  soft-tissue]
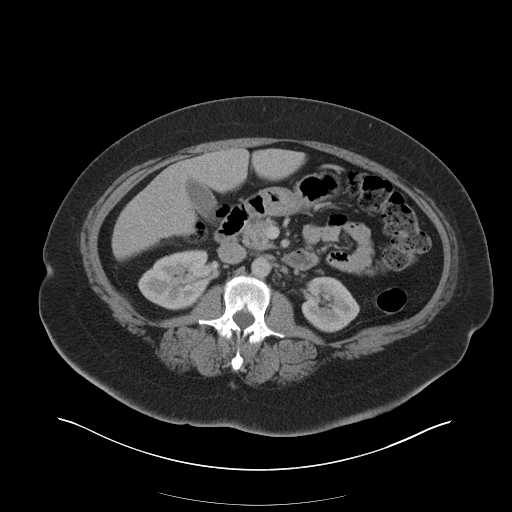
[im 57/90  bone]
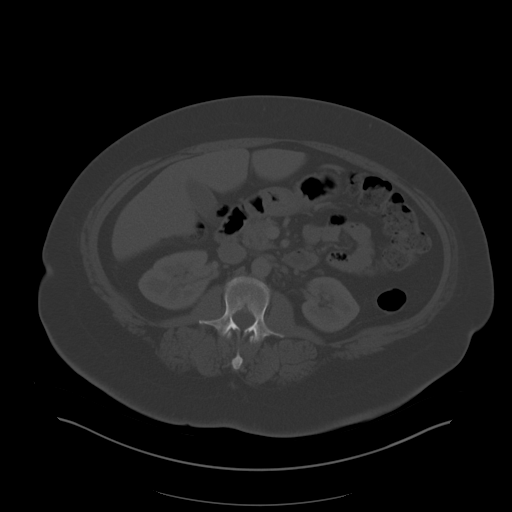
[im 66/90  soft-tissue]
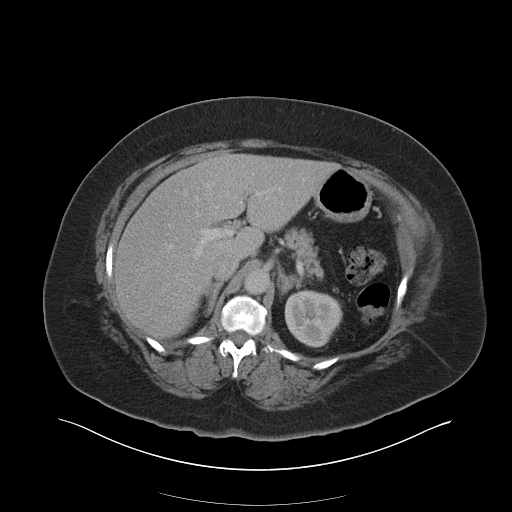
[im 71/90  soft-tissue]
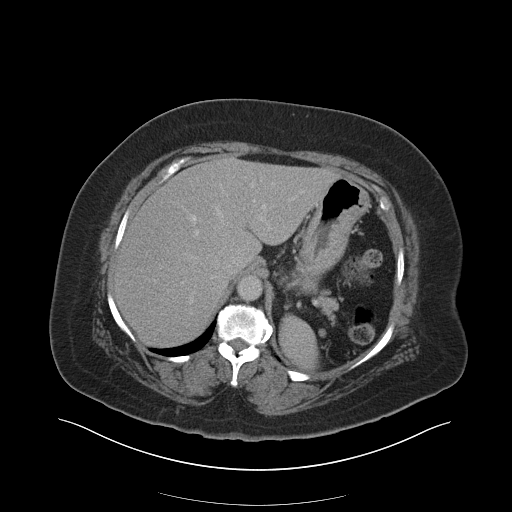
[im 71/90  lung]
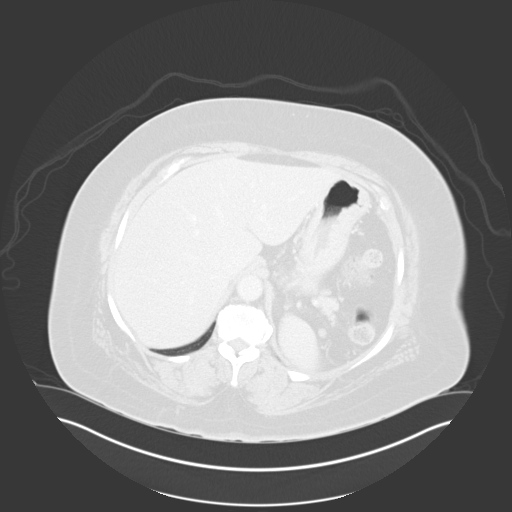
[im 75/90  soft-tissue]
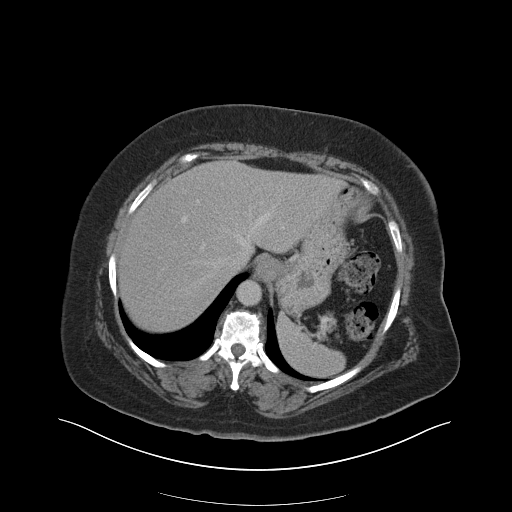
[im 75/90  lung]
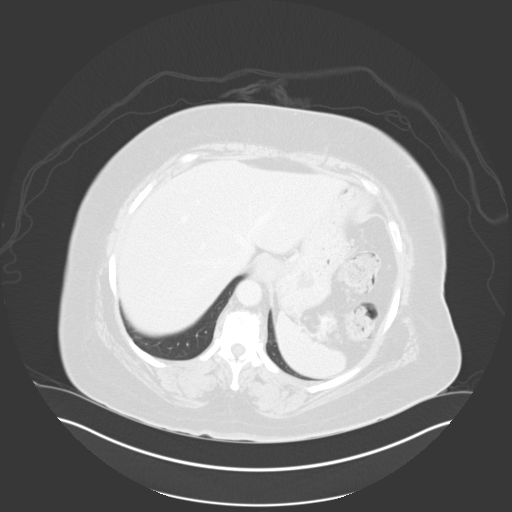
[im 80/90  lung]
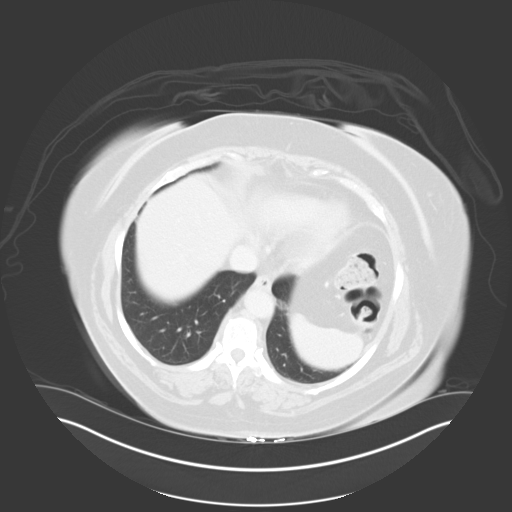
[im 85/90  soft-tissue]
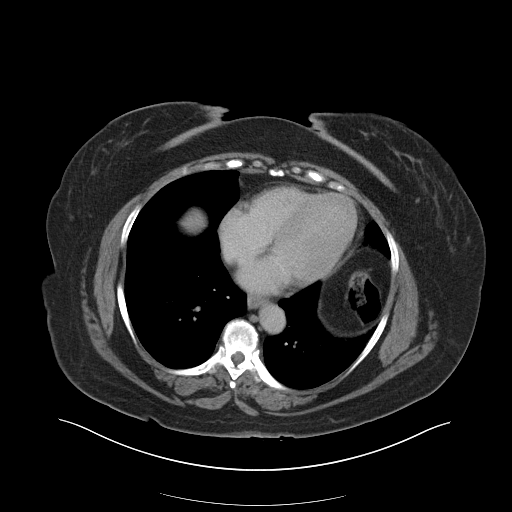
[im 85/90  lung]
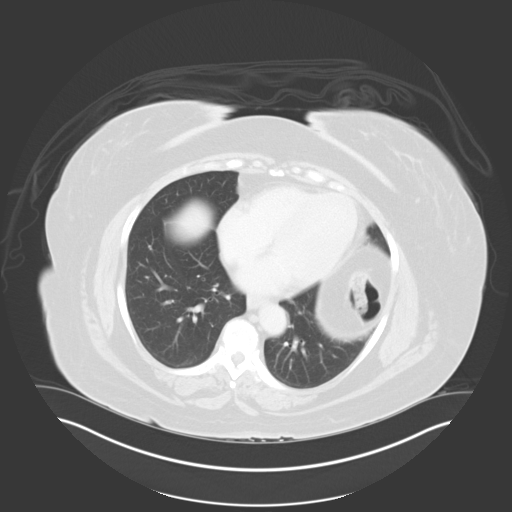

[14 of 32 positions shown; findings below may reference images not displayed]

FINDINGS: Lower chest: The lung bases appear clear. No pleural or pericardial
effusion noted.

Hepatobiliary: For calcified granuloma noted within the left lobe of
liver. The liver is otherwise normal. Gallbladder is unremarkable.
No biliary dilatation.

Pancreas: Negative

Spleen: The spleen appears normal.

Adrenals/Urinary Tract: Normal adrenal glands. The right kidney
appears normal. Unremarkable appearance of the left kidney. The
urinary bladder appears within normal limits.

Stomach/Bowel: The stomach is within normal limits. The small bowel
loops have a normal course and caliber. No obstruction. Normal
appearance of the colon. The appendix is visualized and appears
normal.

Vascular/Lymphatic: Calcified atherosclerotic disease involves the
abdominal aorta. No aneurysm. No enlarged retroperitoneal or
mesenteric adenopathy. No enlarged pelvic or inguinal lymph nodes.

Reproductive: The uterus and the adnexal structures appear within
normal limits.

Other: There is no ascites or focal fluid collections within the
abdomen or pelvis.

Musculoskeletal: Mild degenerative disc disease within the lower
thoracic spine. Bilateral lumbar facet hypertrophy and degenerative
change noted.
IMPRESSION: 1. No acute findings within the abdomen or pelvis. No explanation
for patient's abdominal pain and emesis.

## 2018-11-26 ENCOUNTER — Other Ambulatory Visit: Payer: Self-pay

## 2018-11-26 DIAGNOSIS — Z20822 Contact with and (suspected) exposure to covid-19: Secondary | ICD-10-CM

## 2018-11-28 LAB — NOVEL CORONAVIRUS, NAA: SARS-CoV-2, NAA: NOT DETECTED

## 2022-10-26 ENCOUNTER — Other Ambulatory Visit: Payer: Self-pay | Admitting: Internal Medicine

## 2022-10-26 DIAGNOSIS — N184 Chronic kidney disease, stage 4 (severe): Secondary | ICD-10-CM

## 2022-11-03 ENCOUNTER — Inpatient Hospital Stay
Admission: RE | Admit: 2022-11-03 | Discharge: 2022-11-03 | Discharge status: Home / Self Care | Payer: Medicare Other | Source: Ambulatory Visit | Attending: Internal Medicine | Admitting: Internal Medicine

## 2022-11-03 DIAGNOSIS — N184 Chronic kidney disease, stage 4 (severe): Secondary | ICD-10-CM

## 2023-05-31 ENCOUNTER — Encounter: Admitting: Family

## 2023-06-05 NOTE — Progress Notes (Signed)
 This encounter was created in error - please disregard.

## 2023-06-14 ENCOUNTER — Ambulatory Visit: Admitting: Family

## 2023-06-14 ENCOUNTER — Encounter: Payer: Self-pay | Admitting: Family

## 2023-06-14 VITALS — BP 130/70 | HR 75 | Ht 63.5 in | Wt 257.0 lb

## 2023-06-14 DIAGNOSIS — E785 Hyperlipidemia, unspecified: Secondary | ICD-10-CM

## 2023-06-14 DIAGNOSIS — J302 Other seasonal allergic rhinitis: Secondary | ICD-10-CM

## 2023-06-14 DIAGNOSIS — I10 Essential (primary) hypertension: Secondary | ICD-10-CM

## 2023-06-14 DIAGNOSIS — E2839 Other primary ovarian failure: Secondary | ICD-10-CM

## 2023-06-14 DIAGNOSIS — M25561 Pain in right knee: Secondary | ICD-10-CM

## 2023-06-14 DIAGNOSIS — M25512 Pain in left shoulder: Secondary | ICD-10-CM

## 2023-06-14 DIAGNOSIS — N184 Chronic kidney disease, stage 4 (severe): Secondary | ICD-10-CM | POA: Diagnosis not present

## 2023-06-14 DIAGNOSIS — R2681 Unsteadiness on feet: Secondary | ICD-10-CM

## 2023-06-14 DIAGNOSIS — G8929 Other chronic pain: Secondary | ICD-10-CM

## 2023-06-14 DIAGNOSIS — E1165 Type 2 diabetes mellitus with hyperglycemia: Secondary | ICD-10-CM

## 2023-06-14 DIAGNOSIS — F321 Major depressive disorder, single episode, moderate: Secondary | ICD-10-CM

## 2023-06-14 MED ORDER — FREESTYLE LIBRE 3 SENSOR MISC: 3 refills | Status: DC

## 2023-06-14 MED ORDER — TIRZEPATIDE-WEIGHT MANAGEMENT 2.5 MG/0.5ML ~~LOC~~ SOLN: 2.5000 mg | SUBCUTANEOUS | 3 refills | Status: DC

## 2023-06-14 MED ORDER — SERTRALINE HCL 25 MG PO TABS: 25.0000 mg | ORAL_TABLET | Freq: Every day | ORAL | 3 refills | Status: DC

## 2023-06-14 MED ORDER — FLUTICASONE PROPIONATE 50 MCG/ACT NA SUSP: 2.0000 | Freq: Every day | NASAL | 3 refills | Status: DC | PRN

## 2023-06-14 NOTE — Patient Instructions (Signed)
 Please get shingles vaccine and Tetanus vaccine at the Pharmacy.

## 2023-06-16 ENCOUNTER — Telehealth: Payer: Self-pay

## 2023-06-16 NOTE — Telephone Encounter (Signed)
 Incoming fax received from patients pharmacy to initiate a prior authorization for                   .  PA initiated through covermymeds. Key: [id number]  Awaiting reply from the insurance company which will be determined in 24-72 hours.

## 2023-06-18 NOTE — Progress Notes (Signed)
 Provider: Christean Courts FNP-C   Madison Edwards, Madison Guadeloupe, NP  Patient Care Team: Neely Kammerer, Madison Guadeloupe, NP as PCP - General (Family Medicine)  Extended Emergency Contact Information Primary Emergency Contact: Olene Berne Address: [address]          [place], [place] [address] [place] Home Phone: [phone] Mobile Phone: [phone] Relation: Spouse  Code Status:  Full Code  Goals of care: Advanced Directive information    04/19/2014    5:36 PM  Advanced Directives  Does Patient Have a Medical Advance Directive? No     Chief Complaint  Patient presents with   New Patient (Initial Visit)    New patient , establish care , she is has medical records from [facility]  , she stated that she hasn't felt the same since had taken Covid/Flu vaccine together about 2 years , she had  fall June of last year and injury of knee and left shoulder .  Joint pain from vaccines     Pre-visit Screening Tool Documentation    f    Discussed the use of AI scribe software for clinical note transcription with the patient, who gave verbal consent to proceed.  History of Present Illness   Madison Edwards is a 78 year old female with type 2 diabetes, chronic kidney disease stage 4, and hypertension who presents to establish care.  She has type 2 diabetes and is not currently taking Ozempic due to cost. She previously took a 0.25 mg dose without issues but stopped when the cost increased. She is interested in weight management options that also aid in diabetes control. She does not currently check her blood sugars at home due to issues with her glucometer. She had lab work done on May 24, 2023, but does not recall her A1c level.  She has chronic kidney disease stage 4 and follows up with a nephrologist every 6 to 8 weeks. She is also attending a diabetes class at the [facility].  She has hypertension and is currently taking several medications for blood pressure management,  including amlodipine 5 mg, carvedilol 25 mg twice daily, losartan with hydrochlorothiazide 100/25 mg, and doxazosin 1 mg daily at bedtime. She does not check her blood pressure at home.  She has a history of high cholesterol and is taking atorvastatin 40 mg at bedtime.  She experiences nasal congestion and headaches, particularly on the left side, and has a history of seasonal allergies. She has not used Flonase  recently due to running out of the prescription and reports needing a refill. She has allergies to dust mites and pollen, which cause sneezing and headaches. She runs two fans in her room to help with air circulation.  She reports pain in her left shoulder and right knee, which worsens at night and affects her sleep. She has had x-rays and physical therapy in the past, and sometimes uses a cane for mobility. She has not had any recent falls.  She has a history of anemia and takes a multivitamin and biotin 5 mg daily. She also takes vitamin D 50,000 units weekly and vitamin E 1,000 units daily.  She has a family history of longevity, with a brother who is 1 years old. She has three sons, one of whom has had knee surgery.  She has a history of depression, which is exacerbated by her inability to perform household tasks due to physical limitations. She experiences tingling in her left foot and occasional numbness in her  hands.  She experienced a severe reaction to a COVID booster and flu shot taken simultaneously, which resulted in prolonged illness and exacerbation of arthritis symptoms.    Past Medical History:  Diagnosis Date   Diabetes mellitus without complication (HCC)    Hypertension    Past Surgical History:  Procedure Laterality Date   COLONOSCOPY  12/2008    Allergies  Allergen Reactions   Dust Mite Extract Other (See Comments)    Sneezing and Headache.   Pollen Extract Other (See Comments)    Sneezing and Headache.     Allergies as of 06/14/2023       Reactions    Dust Mite Extract Other (See Comments)   Sneezing and Headache.   Pollen Extract Other (See Comments)   Sneezing and Headache.        Medication List        Accurate as of June 14, 2023 11:59 PM. If you have any questions, ask your nurse or doctor.          STOP taking these medications    bisoprolol-hydrochlorothiazide 5-6.25 MG tablet Commonly known as: ZIAC Stopped by: Avaley Coop C Ziyad Dyar   omega-3 acid ethyl esters 1 g capsule Commonly known as: LOVAZA Stopped by: Aleeza Bellville C Ceasia Elwell   Ozempic (0.25 or 0.5 MG/DOSE) 2 MG/3ML Sopn Generic drug: Semaglutide(0.25 or 0.5MG /DOS) Stopped by: Toivo Bordon C Rheta Hemmelgarn   vitamin B-12 100 MCG tablet Commonly known as: CYANOCOBALAMIN Stopped by: Eliane Hammersmith C Anup Brigham       TAKE these medications    amLODipine 5 MG tablet Commonly known as: NORVASC Take 5 mg by mouth daily.   atorvastatin 40 MG tablet Commonly known as: LIPITOR Take 40 mg by mouth at bedtime.   Biotin 5 MG Tabs Take 1 tablet by mouth daily.   carvedilol 25 MG tablet Commonly known as: COREG Take 25 mg by mouth 2 (two) times daily.   doxazosin 1 MG tablet Commonly known as: CARDURA Take 1 mg by mouth at bedtime.   fluticasone  50 MCG/ACT nasal spray Commonly known as: FLONASE  Place 2 sprays into both nostrils daily as needed for allergies or rhinitis (allergies).   FreeStyle Libre 3 Sensor Misc Place 1 sensor on the skin every 14 days. Use to check glucose continuously Started by: Kateryn Marasigan C Bastien Strawser   glimepiride 4 MG tablet Commonly known as: AMARYL Take 4 mg by mouth daily.   latanoprost 0.005 % ophthalmic solution Commonly known as: XALATAN 1 drop at bedtime.   losartan-hydrochlorothiazide 100-25 MG tablet Commonly known as: HYZAAR Take 1 tablet by mouth daily.   MULTIVITAMIN & MINERAL PO Take 1 tablet by mouth daily.   pantoprazole 40 MG tablet Commonly known as: PROTONIX Take 40 mg by mouth daily.   sertraline  25 MG tablet Commonly known as:  ZOLOFT  Take 1 tablet (25 mg total) by mouth daily. Started by: Dannon Nguyenthi C Rukiya Hodgkins   timolol 0.25 % ophthalmic solution Commonly known as: TIMOPTIC SMARTSIG:In Eye(s)   tirzepatide  2.5 MG/0.5ML injection vial Commonly known as: ZEPBOUND  Inject 2.5 mg into the skin once a week. Started by: Madison Edwards Davone Shinault   Vitamin D (Ergocalciferol) 1.25 MG (50000 UNIT) Caps capsule Commonly known as: DRISDOL Take 50,000 Units by mouth every 7 (seven) days.   vitamin E 1000 UNIT capsule Take 1,000 Units by mouth daily.        Review of Systems  Constitutional:  Negative for appetite change, chills, fatigue, fever and unexpected weight change.  HENT:  Positive for congestion.  Negative for dental problem, ear discharge, ear pain, facial swelling, hearing loss, nosebleeds, postnasal drip, rhinorrhea, sinus pressure, sinus pain, sneezing, sore throat, tinnitus and trouble swallowing.   Eyes:  Negative for pain, discharge, redness, itching and visual disturbance.  Respiratory:  Negative for cough, chest tightness, shortness of breath and wheezing.   Cardiovascular:  Negative for chest pain, palpitations and leg swelling.  Gastrointestinal:  Negative for abdominal distention, abdominal pain, blood in stool, constipation, diarrhea, nausea and vomiting.  Endocrine: Negative for cold intolerance, heat intolerance, polydipsia, polyphagia and polyuria.  Genitourinary:  Negative for difficulty urinating, dysuria, flank pain, frequency and urgency.  Musculoskeletal:  Positive for arthralgias and gait problem. Negative for back pain, joint swelling, myalgias, neck pain and neck stiffness.       Left shoulder and right knee pain   Skin:  Negative for color change, pallor, rash and wound.  Neurological:  Positive for headaches. Negative for dizziness, syncope, speech difficulty, weakness, light-headedness and numbness.       Tingling on left foot   Hematological:  Does not bruise/bleed easily.   Psychiatric/Behavioral:  Negative for agitation, behavioral problems, confusion, hallucinations, self-injury, sleep disturbance and suicidal ideas. The patient is not nervous/anxious.      There is no immunization history on file for this patient. Pertinent  Health Maintenance Due  Topic Date Due   DEXA SCAN  Never done   INFLUENZA VACCINE  08/11/2023      06/14/2023    3:22 PM  Fall Risk  Falls in the past year? 0  Was there an injury with Fall? 0  Fall Risk Category Calculator 0  Patient at Risk for Falls Due to No Fall Risks  Fall risk Follow up Falls evaluation completed   Functional Status Survey:    Vitals:   06/14/23 1506  BP: 130/70  Pulse: 75  SpO2: 98%  Weight: 257 lb (116.6 kg)  Height: 5' 3.5" (1.613 m)   Body mass index is 44.81 kg/m. Physical Exam  VITALS: P- 75, BP- 130/70, SaO2- 98% MEASUREMENTS: Weight- 259.6. GENERAL: Alert, cooperative, well developed, no acute distress. HEENT: Normocephalic, normal oropharynx, moist mucous membranes, ears normal bilaterally, nasal congestion on the right side, eyes normal. NECK: Thyroid normal, no cervical adenopathy. CHEST: Clear to auscultation bilaterally, no wheezes, rhonchi, or crackles. CARDIOVASCULAR: Normal heart rate and rhythm, S1 and S2 normal without murmurs. ABDOMEN: Soft, non-tender, non-distended, without organomegaly, normal bowel sounds. EXTREMITIES: No cyanosis or edema, no significant swelling in legs. NEUROLOGICAL: Cranial nerves grossly intact, moves all extremities without gross motor or sensory deficit, sensation intact in feet.  SKIN: No rash,no lesion or erythema   PSYCHIATRY/BEHAVIORAL: Mood stable   Labs reviewed: No results for input(s): "NA", "K", "CL", "CO2", "GLUCOSE", "BUN", "CREATININE", "CALCIUM", "MG", "PHOS" in the last 8760 hours. No results for input(s): "AST", "ALT", "ALKPHOS", "BILITOT", "PROT", "ALBUMIN" in the last 8760 hours. No results for input(s): "WBC", "NEUTROABS",  "HGB", "HCT", "MCV", "PLT" in the last 8760 hours. No results found for: "TSH" Lab Results  Component Value Date   HGBA1C (H) 12/02/2009    6.8 (NOTE)  According to the ADA Clinical Practice Recommendations for 2011, when HbA1c is used as a screening test:   >=6.5%   Diagnostic of Diabetes Mellitus           (if abnormal result  is confirmed)  5.7-6.4%   Increased risk of developing Diabetes Mellitus  References:Diagnosis and Classification of Diabetes Mellitus,Diabetes Care,2011,34(Suppl 1):S62-S69 and Standards of Medical Care in         Diabetes - 2011,Diabetes Care,2011,34  (Suppl 1):S11-S61.   No results found for: "CHOL", "HDL", "LDLCALC", "LDLDIRECT", "TRIG", "CHOLHDL"  Significant Diagnostic Results in last 30 days:  No results found.  Assessment/Plan  Type 2 Diabetes Mellitus with Chronic Kidney Disease Stage 4 Chronic condition with regular nephrology follow-up every 6-8 weeks. Discontinued Ozempic due to cost. Discussed Mounjaro  as an alternative for weight loss and glycemic control. Not currently monitoring blood glucose at home due to equipment issues. Plan to initiate Mounjaro  at a lower dose to assess tolerance and adjust as needed. - Send prescription for Mounjaro  to pharmacy - Request medical records to verify recent A1c - Send request for [facility] for blood glucose monitoring - Follow up in one month to assess blood glucose control and medication tolerance  Hypertension Managed with amlodipine, carvedilol, losartan with hydrochlorothiazide, and doxazosin. Blood pressure recorded at 130/70 mmHg. - Continue current antihypertensive regimen  Hyperlipidemia Managed with atorvastatin 40 mg at bedtime. - Continue atorvastatin 40 mg at bedtime  Anemia Chronic condition, possibly managed with biotin. Recent lab work pending review for anemia status. - Review recent lab work for anemia  status  Osteoarthritis Chronic pain in left shoulder and right knee, exacerbated at night. Previous x-rays and physical therapy. Pain limits mobility and affects sleep. Expressed fear of knee replacement surgery. - Recommend cold compress and Voltaren gel for pain management - Suggest Tylenol for pain relief before activity - Encourage follow-up with orthopedic specialist for potential knee injection  Depression Intermittent symptoms related to physical limitations. Previously used Zoloft . Discussed dietary modifications to support mood, including reducing sugary and starchy foods and increasing omega-3 and omega-6 intake. - Start sertraline  (Zoloft ) at a low dose - Monitor for improvement over 4 weeks - Discuss dietary modifications to support mood improvement  Allergic Rhinitis Chronic condition with year-round symptoms, worse in spring and fall. Symptoms include nasal congestion and headaches, primarily on the left side. Previously used Flonase , currently out of prescription. - Refill Flonase  prescription  General Health Maintenance Due for tetanus, pneumonia, and shingles vaccines. No recent bone density scan. Previous adverse reaction to COVID-19 vaccine and flu shot, resulting in prolonged illness and exacerbation of arthritis. - Recommend tetanus and shingles vaccines at pharmacy - Check medical records for pneumonia vaccine status - Order bone density scan  Follow-up Regular follow-up needed to monitor chronic conditions and response to new treatments. - Schedule follow-up appointment in one month - Schedule Medicare wellness visit   Family/ staff Communication: Reviewed plan of care with patient verbalized understanding   Labs/tests ordered:  - Bone density  - urine Microalbumin creatinine ratio   Next Appointment : Return in about 1 month (around 07/14/2023) for Weight check and Depression.Annual wellness visit soon. Aaron Aas  Spent 45 minutes of Face to face and non-face to  face with patient  >50% time spent counseling; reviewing medical record; tests; labs; documentation and developing future plan of care.   Estil Heman, NP

## 2023-07-09 ENCOUNTER — Other Ambulatory Visit: Payer: Self-pay | Admitting: Family

## 2023-07-09 DIAGNOSIS — F321 Major depressive disorder, single episode, moderate: Secondary | ICD-10-CM

## 2023-07-19 ENCOUNTER — Ambulatory Visit: Admitting: Family

## 2023-08-16 ENCOUNTER — Ambulatory Visit: Admitting: Family

## 2023-08-16 ENCOUNTER — Encounter: Payer: Self-pay | Admitting: Family

## 2023-08-16 VITALS — BP 136/82 | HR 60 | Temp 97.8°F | Resp 20 | Ht 63.5 in | Wt 276.0 lb

## 2023-08-16 DIAGNOSIS — M25561 Pain in right knee: Secondary | ICD-10-CM

## 2023-08-16 DIAGNOSIS — I10 Essential (primary) hypertension: Secondary | ICD-10-CM | POA: Diagnosis not present

## 2023-08-16 DIAGNOSIS — G8929 Other chronic pain: Secondary | ICD-10-CM

## 2023-08-16 DIAGNOSIS — M25512 Pain in left shoulder: Secondary | ICD-10-CM

## 2023-08-16 DIAGNOSIS — F321 Major depressive disorder, single episode, moderate: Secondary | ICD-10-CM | POA: Diagnosis not present

## 2023-08-16 DIAGNOSIS — E1165 Type 2 diabetes mellitus with hyperglycemia: Secondary | ICD-10-CM

## 2023-08-16 DIAGNOSIS — R2681 Unsteadiness on feet: Secondary | ICD-10-CM

## 2023-08-16 DIAGNOSIS — D509 Iron deficiency anemia, unspecified: Secondary | ICD-10-CM

## 2023-08-16 DIAGNOSIS — E785 Hyperlipidemia, unspecified: Secondary | ICD-10-CM

## 2023-08-16 MED ORDER — FREESTYLE LIBRE 3 SENSOR MISC: 3 refills | Status: DC

## 2023-08-16 MED ORDER — TIRZEPATIDE-WEIGHT MANAGEMENT 2.5 MG/0.5ML ~~LOC~~ SOLN: 2.5000 mg | SUBCUTANEOUS | 3 refills | Status: DC

## 2023-08-17 ENCOUNTER — Telehealth: Payer: Self-pay

## 2023-08-17 LAB — MICROALBUMIN / CREATININE URINE RATIO
Creatinine, Urine: 120 mg/dL (ref 20–275)
Microalb Creat Ratio: 798 mg/g{creat} — ABNORMAL HIGH (ref <30)
Microalb, Ur: 95.7 mg/dL

## 2023-08-17 NOTE — Telephone Encounter (Signed)
 Incoming fax received from patients pharmacy to initiate a prior authorization for                   .  PA initiated through covermymeds. Key: [id number]  Awaiting reply from the insurance company which will be determined in 48-72 hours.

## 2023-08-17 NOTE — Telephone Encounter (Signed)
 Incoming fax received to complete prior authorization for Zepbound . Upon attempt to complete via covermymeds I received the following message:    Below is a snippet of denial from about 2 months ago:   Full letter was printed from covermymeds and placed on Dinah's Desk. FYI-the above letter is from 2 months ago. Outgoing call placed to patient, no answer, and voicemail full. We will need to make additional attempts to reach patient. Reason for call was to inform her that her insurance will not cover Zepbound  and question if she prefers to pay out of pocket or have provider change do an alternative.

## 2023-08-18 ENCOUNTER — Ambulatory Visit: Payer: Self-pay | Admitting: Family

## 2023-08-18 DIAGNOSIS — E1165 Type 2 diabetes mellitus with hyperglycemia: Secondary | ICD-10-CM

## 2023-08-18 DIAGNOSIS — R809 Proteinuria, unspecified: Secondary | ICD-10-CM

## 2023-08-20 NOTE — Progress Notes (Signed)
 Provider: Roxan Plough Edwards  Madison Edwards, Madison BROCKS, NP  Patient Care Team: Madison Edwards, Madison BROCKS, NP as PCP - General (Family Medicine)  Extended Emergency Contact Information Primary Emergency Contact: Madison Edwards Address: [address]          [place], [place] [address] [place] Home Phone: [phone] Mobile Phone: [phone] Relation: Spouse  Code Status:  Full Code  Goals of care: Advanced Directive information    08/16/2023    2:59 PM  Advanced Directives  Does Patient Have a Medical Advance Directive? No  Would patient like information on creating a medical advance directive? No - Patient declined     Chief Complaint  Patient presents with   Follow-up    1 Month follow for weight gain & depression.    Discussed the use of AI scribe software for clinical note transcription with the patient, who gave verbal consent to proceed.  History of Present Illness   Madison Edwards is a 78 year old female who presents for a one month follow-up to recheck weight and depression.  She has not started the prescribed sertraline  for depression due to a delay in filling the prescription. She has a history of using Zoloft  for intermittent symptoms of depression. She does not feel depressed and attributes her symptoms to the COVID and flu shots.  She experienced two falls while on vacation, both times falling backwards on her left side while getting in and out of a tub. She hit her head during these falls but did not seek medical attention. Her eyes were not clear after the falls, but she felt okay otherwise. She continues to experience some tenderness on her left side from the falls.  She mentions not feeling right since receiving a COVID and flu shot together, which she believes aggravated her arthritis. She has not taken the Mounjaro  (Zepbound ) injection for weight management due to cost issues and insurance not covering it. Her weight has increased from 257 to 276 pounds  since the last visit.  She is currently taking amlodipine for blood pressure, vitamin E every other day, and doxazosin 1 mg twice a day. She is not taking biotin. She is not currently checking her blood sugars as her machine is not working, and she has not received the [employer] sensor from the pharmacy.  She uses a cane but does not feel steady and has used a walker in the past, which she finds steadier. No current urine issues or frequent urination.    Past Medical History:  Diagnosis Date   Diabetes mellitus without complication (HCC)    Hypertension    Past Surgical History:  Procedure Laterality Date   COLONOSCOPY  12/2008    Allergies  Allergen Reactions   Dust Mite Extract Other (See Comments)    Sneezing and Headache.   Pollen Extract Other (See Comments)    Sneezing and Headache.     Outpatient Encounter Medications as of 08/16/2023  Medication Sig   ACETAMINOPHEN PO Take 500 mg by mouth as needed (take 1,000).   amLODipine (NORVASC) 5 MG tablet Take 5 mg by mouth daily.   AMLODIPINE BESYLATE PO Take 10 mg by mouth daily.   atorvastatin (LIPITOR) 40 MG tablet Take 40 mg by mouth at bedtime.   Biotin 5 MG TABS Take 1 tablet by mouth daily.   carvedilol (COREG) 25 MG tablet Take 25 mg by mouth 2 (two) times daily.   doxazosin (CARDURA) 1 MG tablet Take 1 mg  by mouth at bedtime.   DOXAZOSIN MESYLATE PO Take 2 mg by mouth at bedtime.   ferrous sulfate 325 (65 FE) MG tablet Take 325 mg by mouth every other day.   fluticasone  (FLONASE ) 50 MCG/ACT nasal spray Place 2 sprays into both nostrils daily as needed for allergies or rhinitis (allergies).   glimepiride (AMARYL) 4 MG tablet Take 4 mg by mouth daily.   latanoprost (XALATAN) 0.005 % ophthalmic solution 1 drop at bedtime.   losartan-hydrochlorothiazide (HYZAAR) 100-25 MG tablet Take 1 tablet by mouth daily.   Multiple Vitamins-Minerals (MULTIVITAMIN & MINERAL PO) Take 1 tablet by mouth daily.   pantoprazole  (PROTONIX) 40 MG tablet Take 40 mg by mouth daily.   timolol (TIMOPTIC) 0.25 % ophthalmic solution SMARTSIG:In Eye(s)   Vitamin D, Ergocalciferol, (DRISDOL) 1.25 MG (50000 UNIT) CAPS capsule Take 50,000 Units by mouth every 7 (seven) days.   vitamin E 1000 UNIT capsule Take 1,000 Units by mouth daily. (Patient taking differently: Take 1,000 Units by mouth every other day.)   Continuous Glucose Sensor (FREESTYLE LIBRE 3 SENSOR) MISC Place 1 sensor on the skin every 14 days. Use to check glucose continuously   sertraline  (ZOLOFT ) 25 MG tablet TAKE 1 TABLET (25 MG TOTAL) BY MOUTH DAILY. (Patient not taking: Reported on 08/16/2023)   tirzepatide  (ZEPBOUND ) 2.5 MG/0.5ML injection vial Inject 2.5 mg into the skin once a week.   [DISCONTINUED] Continuous Glucose Sensor (FREESTYLE LIBRE 3 SENSOR) MISC Place 1 sensor on the skin every 14 days. Use to check glucose continuously (Patient not taking: Reported on 08/16/2023)   [DISCONTINUED] tirzepatide  (ZEPBOUND ) 2.5 MG/0.5ML injection vial Inject 2.5 mg into the skin once a week. (Patient not taking: Reported on 08/16/2023)   No facility-administered encounter medications on file as of 08/16/2023.    Review of Systems  Constitutional:  Positive for unexpected weight change. Negative for appetite change, chills, fatigue and fever.       Weight gain  HENT:  Negative for congestion, ear discharge, ear pain, facial swelling, nosebleeds, postnasal drip, rhinorrhea, sinus pressure, sinus pain, sneezing, sore throat, tinnitus and trouble swallowing.   Eyes:  Negative for pain, discharge, redness, itching and visual disturbance.  Respiratory:  Negative for cough, chest tightness, shortness of breath and wheezing.   Cardiovascular:  Negative for chest pain, palpitations and leg swelling.  Gastrointestinal:  Negative for abdominal distention, abdominal pain, blood in stool, constipation, diarrhea, nausea and vomiting.  Endocrine: Negative for cold intolerance, heat  intolerance, polydipsia, polyphagia and polyuria.  Genitourinary:  Negative for difficulty urinating, dysuria, flank pain, frequency and urgency.  Musculoskeletal:  Positive for gait problem. Negative for arthralgias, back pain, joint swelling, myalgias, neck pain and neck stiffness.  Skin:  Negative for color change, pallor, rash and wound.  Neurological:  Negative for dizziness, syncope, speech difficulty, weakness, light-headedness, numbness and headaches.  Hematological:  Does not bruise/bleed easily.  Psychiatric/Behavioral:  Negative for agitation, behavioral problems, confusion, hallucinations, self-injury, sleep disturbance and suicidal ideas. The patient is not nervous/anxious.     Immunization History  Administered Date(s) Administered   Pfizer Covid-19 Vaccine Bivalent Booster 52yrs & up 03/05/2019, 03/26/2019, 11/30/2019   Pertinent  Health Maintenance Due  Topic Date Due   DEXA SCAN  Never done   INFLUENZA VACCINE  11/16/2023 (Originally 08/11/2023)      06/14/2023    3:22 PM 08/16/2023    2:57 PM  Fall Risk  Falls in the past year? 0 1  Was there an injury with Fall?  0 1  Fall Risk Category Calculator 0 3  Patient at Risk for Falls Due to No Fall Risks Impaired balance/gait  Fall risk Follow up Falls evaluation completed Falls evaluation completed   Functional Status Survey:    Vitals:   08/16/23 1520  BP: 136/82  Pulse: 60  Resp: 20  Temp: 97.8 F (36.6 C)  SpO2: 99%  Weight: 276 lb (125.2 kg)  Height: 5' 3.5 (1.613 m)   Body mass index is 48.12 kg/m. Physical Exam  VITALS: T- 97.8, P- 60, BP- 136/82, SaO2- 99% MEASUREMENTS: Weight- 276. GENERAL: Alert, cooperative, well developed, no acute distress HEENT: Normocephalic, normal oropharynx, moist mucous membranes CHEST: Clear to auscultation bilaterally, no wheezes, rhonchi, or crackles CARDIOVASCULAR: Normal heart rate and rhythm, S1 and S2 normal without murmurs ABDOMEN: Soft, non-tender, non-distended,  without organomegaly, normal bowel sounds EXTREMITIES: No cyanosis, edema, or tenderness NEUROLOGICAL: Cranial nerves grossly intact, moves all extremities without gross motor or sensory deficit  SKIN: No rash,no lesion or erythema   PSYCHIATRY/BEHAVIORAL: Mood stable    Labs reviewed: No results for input(s): NA, K, CL, CO2, GLUCOSE, BUN, CREATININE, CALCIUM, MG, PHOS in the last 8760 hours. No results for input(s): AST, ALT, ALKPHOS, BILITOT, PROT, ALBUMIN in the last 8760 hours. No results for input(s): WBC, NEUTROABS, HGB, HCT, MCV, PLT in the last 8760 hours. No results found for: TSH Lab Results  Component Value Date   HGBA1C (H) 12/02/2009    6.8 (NOTE)                                                                       According to the ADA Clinical Practice Recommendations for 2011, when HbA1c is used as a screening test:   >=6.5%   Diagnostic of Diabetes Mellitus           (if abnormal result  is confirmed)  5.7-6.4%   Increased risk of developing Diabetes Mellitus  References:Diagnosis and Classification of Diabetes Mellitus,Diabetes Care,2011,34(Suppl 1):S62-S69 and Standards of Medical Care in         Diabetes - 2011,Diabetes Care,2011,34  (Suppl 1):S11-S61.   No results found for: CHOL, HDL, LDLCALC, LDLDIRECT, TRIG, CHOLHDL  Significant Diagnostic Results in last 30 days:  No results found.  Assessment/Plan  Obesity Obesity with recent weight gain from 257 lbs to 276 lbs. Difficulty accessing weight management medication due to cost and insurance issues. - Resend prescription for Zepbound  to [facility] on [address]. - Discuss with insurance about coverage for Zepbound . - Consider referral to weight management specialist if insurance issues persist.  Type 2 diabetes mellitus Type 2 diabetes mellitus with difficulty monitoring blood glucose due to malfunctioning glucometer and lack of access to [employer]  sensor. - Resend prescription for [employer] 3 sensor to pharmacy. - Encourage blood glucose monitoring once sensor is obtained.  Major depressive disorder Major depressive disorder with previous prescription for sertraline  not filled until recently. Unaware of depression diagnosis and has not started medication. - Educate about the purpose of sertraline  for depression. - Encourage starting sertraline  as prescribed.  Impaired mobility with falls Impaired mobility with falls, particularly in the shower, resulting in left-sided pain. Currently using a cane but feels unsteady. Has access to a walker which  provides more stability. - Encourage use of walker for improved stability. - Consider physical therapy for strengthening if instability persists. - Advise home exercises to improve stability.  Hypertension Hypertension managed with amlodipine and doxazosin. Blood pressure today is 136/82 mmHg, which is within acceptable range. - Continue current antihypertensive medications: amlodipine and doxazosin.  Hyperlipidemia, no specific discussion or changes in management during this visit.  Anemia, no specific discussion or changes in management during this visit.  Left shoulder and right knee osteoarthritis Osteoarthritis in left shoulder and right knee with reported aggravation after COVID and flu vaccinations.   Family/ staff Communication: Reviewed plan of care with patient verbalized understanding   Labs/tests ordered: None   Next Appointment: Return in about 1 month (around 09/16/2023) for weight check and Depression .   Total time: 20 minutes. Greater than 50% of total time spent doing patient education regarding Obesity,T2DM,HTN, Major depression,HLD,left shoulder and right knee OA,health maintenance including symptom/medication management.   Madison JAYSON Plough, NP

## 2023-08-21 ENCOUNTER — Telehealth: Payer: Self-pay | Admitting: *Deleted

## 2023-08-21 NOTE — Telephone Encounter (Signed)
 Christy with [facility] called regarding Freestyle Libre 3 Prior Authorization Needing A1C lab faxed to Fax: [phone] Case #: [id number] No A1C lab done at our office. Patient established with our practice 06/14/2023.  Also received fax from [facility] information. Filled out and faxed.

## 2023-08-22 ENCOUNTER — Ambulatory Visit: Admitting: Family

## 2023-08-24 NOTE — Addendum Note (Signed)
 Addended by: SUELLEN DEVIN BROCKS on: 08/24/2023 09:48 AM   Modules accepted: Orders

## 2023-09-11 ENCOUNTER — Other Ambulatory Visit: Payer: Self-pay | Admitting: Family

## 2023-09-11 DIAGNOSIS — J302 Other seasonal allergic rhinitis: Secondary | ICD-10-CM

## 2023-09-20 ENCOUNTER — Ambulatory Visit: Admitting: Family

## 2023-09-20 ENCOUNTER — Encounter: Payer: Self-pay | Admitting: Family

## 2023-09-20 VITALS — BP 136/78 | HR 68 | Temp 97.8°F | Resp 20 | Ht 63.5 in | Wt 263.8 lb

## 2023-09-20 DIAGNOSIS — E1165 Type 2 diabetes mellitus with hyperglycemia: Secondary | ICD-10-CM | POA: Diagnosis not present

## 2023-09-20 DIAGNOSIS — I10 Essential (primary) hypertension: Secondary | ICD-10-CM | POA: Diagnosis not present

## 2023-09-20 DIAGNOSIS — F321 Major depressive disorder, single episode, moderate: Secondary | ICD-10-CM

## 2023-09-20 DIAGNOSIS — E559 Vitamin D deficiency, unspecified: Secondary | ICD-10-CM

## 2023-09-20 MED ORDER — TIRZEPATIDE-WEIGHT MANAGEMENT 2.5 MG/0.5ML ~~LOC~~ SOLN: 2.5000 mg | SUBCUTANEOUS | 3 refills | Status: DC

## 2023-09-20 MED ORDER — VITAMIN D (ERGOCALCIFEROL) 1.25 MG (50000 UNIT) PO CAPS: 50000.0000 [IU] | ORAL_CAPSULE | ORAL | 1 refills | Status: DC

## 2023-09-20 MED ORDER — SERTRALINE HCL 50 MG PO TABS: 50.0000 mg | ORAL_TABLET | Freq: Every day | ORAL | 1 refills | Status: DC

## 2023-09-20 MED ORDER — PANTOPRAZOLE SODIUM 40 MG PO TBEC: 40.0000 mg | DELAYED_RELEASE_TABLET | Freq: Every day | ORAL | 1 refills | Status: AC

## 2023-09-20 NOTE — Patient Instructions (Signed)
 1.Stop at check out & schedule Annual Wellness Visit.

## 2023-09-24 NOTE — Progress Notes (Signed)
 Provider: Roxan Plough FNP-C   Jaydin Jalomo, Roxan BROCKS, NP  Patient Care Team: Joyclyn Plazola, Roxan BROCKS, NP as PCP - General (Family Medicine)  Extended Emergency Contact Information Primary Emergency Contact: Delores Victory Raddle Address: [address]          [place], [place] [address] [place] Home Phone: [phone] Mobile Phone: [phone] Relation: Spouse  Code Status:  Full Code  Goals of care: Advanced Directive information    08/16/2023    2:59 PM  Advanced Directives  Does Patient Have a Medical Advance Directive? No  Would patient like information on creating a medical advance directive? No - Patient declined     Chief Complaint  Patient presents with   Follow-up    1 month follow up for weight check & depression.   Discussed the use of AI scribe software for clinical note transcription with the patient, who gave verbal consent to proceed.  History of Present Illness   Madison Edwards is a 78 year old female with osteoarthritis and depression who presents for a follow-up visit to recheck her weight and assess her depression.  She experiences ongoing pain, particularly in her knee, left hand, and arm, attributed to osteoarthritis. The pain is exacerbated by weather changes and is severe enough to cause tears and disrupt daily activities. She received a gel injection today and a steroid shot previously to help manage the pain. She has difficulty gripping with her left hand and compensates by using her right hand more frequently.  She has been taking sertraline  daily for depression, which she feels is more related to her pain than a separate issue. Her inability to perform activities as she would like due to knee pain contributes to her depressive symptoms.  Her blood sugars have improved, but she has not yet received the [facility] sensor for continuous glucose monitoring. Her microalbumin creatinine ratio is high at 798. She is currently taking multiple  medications including amlodipine 10 mg for blood pressure, atorvastatin 40 mg for cholesterol, a B complex vitamin, biotin 5 mg, carvedilol 25 mg, doxazosin 2 mg at bedtime, ferrous sulfate every other day, Flonase  for congestion, glimepiride 4 mg daily for diabetes, latanoprost eye drops, hydrochlorothiazide 100 mg/25 mg, a multivitamin, Protonix , Zoloft  25 mg, and vitamin D  supplements. She ran out of vitamin D  and plans to refill it. She also takes Tylenol frequently for pain relief.  She has allergies to dust mite extract, pollen extract, and orange juice. No numbness or tingling in her legs but reports it in her hand.    Past Medical History:  Diagnosis Date   Diabetes mellitus without complication (HCC)    Hypertension    Past Surgical History:  Procedure Laterality Date   COLONOSCOPY  12/2008    Allergies  Allergen Reactions   Dust Mite Extract Other (See Comments)    Sneezing and Headache.   Pollen Extract Other (See Comments)    Sneezing and Headache.     Allergies as of 09/20/2023       Reactions   Dust Mite Extract Other (See Comments)   Sneezing and Headache.   Pollen Extract Other (See Comments)   Sneezing and Headache.        Medication List        Accurate as of September 20, 2023 11:59 PM. If you have any questions, ask your nurse or doctor.          ACETAMINOPHEN PO Take 500 mg by mouth as needed (take  1,000).   AMLODIPINE BESYLATE PO Take 10 mg by mouth daily. What changed: Another medication with the same name was removed. Continue taking this medication, and follow the directions you see here. Changed by: Elise Knobloch C Nioka Thorington   atorvastatin 40 MG tablet Commonly known as: LIPITOR Take 40 mg by mouth at bedtime.   b complex vitamins capsule Take 1 capsule by mouth daily.   Biotin 5 MG Tabs Take 1 tablet by mouth daily.   carvedilol 25 MG tablet Commonly known as: COREG Take 25 mg by mouth 2 (two) times daily.   DOXAZOSIN MESYLATE PO Take  2 mg by mouth at bedtime. What changed: Another medication with the same name was removed. Continue taking this medication, and follow the directions you see here. Changed by: Willis Kuipers C Surie Suchocki   ferrous sulfate 325 (65 FE) MG tablet Take 325 mg by mouth every other day.   fluticasone  50 MCG/ACT nasal spray Commonly known as: FLONASE  PLACE 2 SPRAYS INTO BOTH NOSTRILS DAILY AS NEEDED FOR ALLERGIES OR RHINITIS (ALLERGIES).   FreeStyle Libre 3 Sensor Misc Place 1 sensor on the skin every 14 days. Use to check glucose continuously   glimepiride 4 MG tablet Commonly known as: AMARYL Take 4 mg by mouth daily.   latanoprost 0.005 % ophthalmic solution Commonly known as: XALATAN 1 drop at bedtime.   losartan-hydrochlorothiazide 100-25 MG tablet Commonly known as: HYZAAR Take 1 tablet by mouth daily.   MULTIVITAMIN & MINERAL PO Take 1 tablet by mouth daily.   pantoprazole  40 MG tablet Commonly known as: PROTONIX  Take 1 tablet (40 mg total) by mouth daily.   sertraline  50 MG tablet Commonly known as: ZOLOFT  Take 1 tablet (50 mg total) by mouth daily. What changed:  medication strength how much to take Changed by: Ezreal Turay C Yvaine Jankowiak   timolol 0.25 % ophthalmic solution Commonly known as: TIMOPTIC SMARTSIG:In Eye(s)   tirzepatide  2.5 MG/0.5ML injection vial Commonly known as: ZEPBOUND  Inject 2.5 mg into the skin once a week.   Vitamin D  (Ergocalciferol ) 1.25 MG (50000 UNIT) Caps capsule Commonly known as: DRISDOL  Take 1 capsule (50,000 Units total) by mouth every 7 (seven) days. What changed: when to take this   vitamin E 1000 UNIT capsule Take 1,000 Units by mouth daily.        Review of Systems  Constitutional:  Negative for appetite change, chills, fatigue, fever and unexpected weight change.  HENT:  Negative for congestion, ear discharge, ear pain, hearing loss, nosebleeds, postnasal drip, rhinorrhea, sinus pressure, sinus pain, sneezing, sore throat and tinnitus.    Eyes:  Negative for pain, discharge, redness, itching and visual disturbance.  Respiratory:  Negative for cough, chest tightness, shortness of breath and wheezing.   Cardiovascular:  Negative for chest pain, palpitations and leg swelling.  Gastrointestinal:  Negative for abdominal distention, abdominal pain, blood in stool, constipation, diarrhea, nausea and vomiting.  Endocrine: Negative for cold intolerance, heat intolerance, polydipsia, polyphagia and polyuria.  Genitourinary:  Negative for difficulty urinating, dysuria, flank pain, frequency and urgency.  Musculoskeletal:  Positive for arthralgias. Negative for back pain, gait problem, joint swelling, myalgias, neck pain and neck stiffness.       Chronic pain hand,knee and cervical  Skin:  Negative for color change, pallor, rash and wound.  Neurological:  Negative for dizziness, syncope, speech difficulty, weakness, light-headedness, numbness and headaches.  Hematological:  Does not bruise/bleed easily.  Psychiatric/Behavioral:  Negative for agitation, behavioral problems, confusion, hallucinations, self-injury, sleep disturbance and suicidal ideas. The  patient is not nervous/anxious.        Depressed due to ongoing chronic pain     Immunization History  Administered Date(s) Administered   Pfizer Covid-19 Vaccine Bivalent Booster 8yrs & up 03/05/2019, 03/26/2019, 11/30/2019   Pertinent  Health Maintenance Due  Topic Date Due   FOOT EXAM  Never done   OPHTHALMOLOGY EXAM  Never done   DEXA SCAN  Never done   HEMOGLOBIN A1C  06/02/2010   Influenza Vaccine  11/16/2023 (Originally 08/11/2023)      06/14/2023    3:22 PM 08/16/2023    2:57 PM  Fall Risk  Falls in the past year? 0 1  Was there an injury with Fall? 0 1  Fall Risk Category Calculator 0 3  Patient at Risk for Falls Due to No Fall Risks Impaired balance/gait  Fall risk Follow up Falls evaluation completed Falls evaluation completed   Functional Status Survey:    Vitals:    09/20/23 1500  BP: 136/78  Pulse: 68  Resp: 20  Temp: 97.8 F (36.6 C)  SpO2: 99%  Weight: 263 lb 12.8 oz (119.7 kg)  Height: 5' 3.5 (1.613 m)   Body mass index is 46 kg/m. Physical Exam  MEASUREMENTS: Weight- 263. GENERAL: Alert, cooperative, well developed, no acute distress. HEENT: Normocephalic, normal oropharynx, moist mucous membranes. CHEST: Clear to auscultation bilaterally, no wheezes, rhonchi, or crackles. CARDIOVASCULAR: Normal heart rate and rhythm, S1 and S2 normal without murmurs. ABDOMEN: Soft, non-tender, non-distended, without organomegaly, normal bowel sounds. EXTREMITIES: No cyanosis, edema, or swelling in legs. NEUROLOGICAL: Cranial nerves grossly intact, moves all extremities without gross motor or sensory deficit.  SKIN: No rash,no lesion or erythema   PSYCHIATRY/BEHAVIORAL: Mood stable    Labs reviewed: No results for input(s): NA, K, CL, CO2, GLUCOSE, BUN, CREATININE, CALCIUM, MG, PHOS in the last 8760 hours. No results for input(s): AST, ALT, ALKPHOS, BILITOT, PROT, ALBUMIN in the last 8760 hours. No results for input(s): WBC, NEUTROABS, HGB, HCT, MCV, PLT in the last 8760 hours. No results found for: TSH Lab Results  Component Value Date   HGBA1C (H) 12/02/2009    6.8 (NOTE)                                                                       According to the ADA Clinical Practice Recommendations for 2011, when HbA1c is used as a screening test:   >=6.5%   Diagnostic of Diabetes Mellitus           (if abnormal result  is confirmed)  5.7-6.4%   Increased risk of developing Diabetes Mellitus  References:Diagnosis and Classification of Diabetes Mellitus,Diabetes Care,2011,34(Suppl 1):S62-S69 and Standards of Medical Care in         Diabetes - 2011,Diabetes Care,2011,34  (Suppl 1):S11-S61.   No results found for: CHOL, HDL, LDLCALC, LDLDIRECT, TRIG, CHOLHDL  Significant Diagnostic Results in  last 30 days:  No results found.  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 She has not yet obtained the [facility] for continuous glucose monitoring. Currently taking glimepiride 4 mg  daily. - Ensure she obtains the [facility] 3 sensor for continuous glucose monitoring. - Monitor blood sugars daily using Freestyle Libre. - Request lab results from kidney clinic, including microalbumin and A1c levels. - Recheck microalbumin levels in four months. - Educate on the importance of blood sugar control to prevent further kidney damage and potential progression to dialysis.  Chronic pain in left hand and knee due to cervical radiculopathy and osteoarthritis Experiences chronic pain in the left hand and knee, likely due to osteoarthritis and cervical radiculopathy. Received a steroid injection recently and is awaiting a gel injection. Pain is exacerbated by weather changes, affecting daily activities. - Administer gel injection for pain management.  Depression Depression symptoms may be exacerbated by chronic pain. Currently taking sertraline  25 mg daily. - Increase citalopram to 50 mg daily for better management of depression symptoms. - Use two 25 mg tablets to achieve the 50 mg dose until the current supply is exhausted, then switch to 50 mg tablets.  Hypertension Hypertension is managed with amlodipine 10 mg daily. - Continue amlodipine 10 mg daily.  Obesity Weight decreased from 276 lbs to 263 lbs over the past month. Has not started Zetban injection due to a high copay of $500, not covered by insurance until January. Weight loss is a positive outcome. - Consider starting Zetban in January when insurance coverage may change and the cost may decrease.   Family/ staff  Communication: Reviewed plan of care with patient verbalized understanding  Labs/tests ordered: None   Next Appointment : Return in about 4 months (around 01/20/2024) for medical mangement of chronic issues., fasting labs prior to visit.   Spent 30 minutes of Face to face and non-face to face with patient  >50% time spent counseling; reviewing medical record; tests; labs; documentation and developing future plan of care.   Roxan JAYSON Plough, NP

## 2023-11-17 ENCOUNTER — Other Ambulatory Visit

## 2023-11-24 ENCOUNTER — Other Ambulatory Visit: Payer: Self-pay | Admitting: Family

## 2023-11-24 DIAGNOSIS — E559 Vitamin D deficiency, unspecified: Secondary | ICD-10-CM

## 2023-12-21 LAB — BASIC METABOLIC PANEL WITH GFR
BUN: 36 — AB (ref 4–21)
CO2: 19 (ref 13–22)
Chloride: 108 (ref 99–108)
Creatinine: 2.7 — AB (ref 0.5–1.1)
Glucose: 70
Sodium: 140 (ref 137–147)

## 2023-12-21 LAB — CBC AND DIFFERENTIAL: Hemoglobin: 9 — AB (ref 12.0–16.0)

## 2023-12-21 LAB — COMPREHENSIVE METABOLIC PANEL WITH GFR
Albumin: 4 (ref 3.5–5.0)
Calcium: 9.1 (ref 8.7–10.7)
eGFR: 17

## 2023-12-25 LAB — IRON,TIBC AND FERRITIN PANEL
%SAT: 19
Ferritin: 80
Iron: 53
PTH: 120
TIBC: 281
UIBC: 228

## 2023-12-27 ENCOUNTER — Ambulatory Visit: Payer: Self-pay | Admitting: Family

## 2024-01-09 ENCOUNTER — Encounter (HOSPITAL_COMMUNITY): Payer: Self-pay | Admitting: Pharmacy Technician

## 2024-01-09 ENCOUNTER — Emergency Department (HOSPITAL_COMMUNITY)

## 2024-01-09 ENCOUNTER — Other Ambulatory Visit: Payer: Self-pay

## 2024-01-09 ENCOUNTER — Inpatient Hospital Stay (HOSPITAL_COMMUNITY)
Admission: EM | Admit: 2024-01-09 | Discharge: 2024-01-19 | DRG: 193 | Disposition: A | Discharge status: Skilled Nursing Facility | Attending: Internal Medicine | Admitting: Internal Medicine

## 2024-01-09 DIAGNOSIS — N184 Chronic kidney disease, stage 4 (severe): Secondary | ICD-10-CM | POA: Diagnosis present

## 2024-01-09 DIAGNOSIS — L8990 Pressure ulcer of unspecified site, unspecified stage: Secondary | ICD-10-CM | POA: Insufficient documentation

## 2024-01-09 DIAGNOSIS — E782 Mixed hyperlipidemia: Secondary | ICD-10-CM

## 2024-01-09 DIAGNOSIS — I160 Hypertensive urgency: Secondary | ICD-10-CM

## 2024-01-09 DIAGNOSIS — F321 Major depressive disorder, single episode, moderate: Secondary | ICD-10-CM

## 2024-01-09 DIAGNOSIS — E11649 Type 2 diabetes mellitus with hypoglycemia without coma: Secondary | ICD-10-CM | POA: Diagnosis not present

## 2024-01-09 DIAGNOSIS — T465X6A Underdosing of other antihypertensive drugs, initial encounter: Secondary | ICD-10-CM | POA: Diagnosis present

## 2024-01-09 DIAGNOSIS — I129 Hypertensive chronic kidney disease with stage 1 through stage 4 chronic kidney disease, or unspecified chronic kidney disease: Secondary | ICD-10-CM | POA: Diagnosis present

## 2024-01-09 DIAGNOSIS — I10 Essential (primary) hypertension: Secondary | ICD-10-CM | POA: Diagnosis present

## 2024-01-09 DIAGNOSIS — Z1152 Encounter for screening for COVID-19: Secondary | ICD-10-CM

## 2024-01-09 DIAGNOSIS — E66813 Obesity, class 3: Secondary | ICD-10-CM | POA: Diagnosis present

## 2024-01-09 DIAGNOSIS — Z7985 Long-term (current) use of injectable non-insulin antidiabetic drugs: Secondary | ICD-10-CM

## 2024-01-09 DIAGNOSIS — Z6841 Body Mass Index (BMI) 40.0 and over, adult: Secondary | ICD-10-CM

## 2024-01-09 DIAGNOSIS — E1122 Type 2 diabetes mellitus with diabetic chronic kidney disease: Secondary | ICD-10-CM | POA: Diagnosis present

## 2024-01-09 DIAGNOSIS — Z823 Family history of stroke: Secondary | ICD-10-CM

## 2024-01-09 DIAGNOSIS — E162 Hypoglycemia, unspecified: Principal | ICD-10-CM

## 2024-01-09 DIAGNOSIS — N179 Acute kidney failure, unspecified: Secondary | ICD-10-CM | POA: Diagnosis present

## 2024-01-09 DIAGNOSIS — L89312 Pressure ulcer of right buttock, stage 2: Secondary | ICD-10-CM | POA: Diagnosis not present

## 2024-01-09 DIAGNOSIS — Z8261 Family history of arthritis: Secondary | ICD-10-CM

## 2024-01-09 DIAGNOSIS — G9341 Metabolic encephalopathy: Secondary | ICD-10-CM | POA: Diagnosis present

## 2024-01-09 DIAGNOSIS — E871 Hypo-osmolality and hyponatremia: Secondary | ICD-10-CM | POA: Diagnosis present

## 2024-01-09 DIAGNOSIS — Z833 Family history of diabetes mellitus: Secondary | ICD-10-CM

## 2024-01-09 DIAGNOSIS — F32A Depression, unspecified: Secondary | ICD-10-CM | POA: Diagnosis present

## 2024-01-09 DIAGNOSIS — Z8042 Family history of malignant neoplasm of prostate: Secondary | ICD-10-CM

## 2024-01-09 DIAGNOSIS — E1165 Type 2 diabetes mellitus with hyperglycemia: Secondary | ICD-10-CM | POA: Diagnosis not present

## 2024-01-09 DIAGNOSIS — E861 Hypovolemia: Secondary | ICD-10-CM | POA: Diagnosis present

## 2024-01-09 DIAGNOSIS — Z91148 Patient's other noncompliance with medication regimen for other reason: Secondary | ICD-10-CM

## 2024-01-09 DIAGNOSIS — Z8249 Family history of ischemic heart disease and other diseases of the circulatory system: Secondary | ICD-10-CM

## 2024-01-09 DIAGNOSIS — E559 Vitamin D deficiency, unspecified: Secondary | ICD-10-CM | POA: Diagnosis present

## 2024-01-09 DIAGNOSIS — Z79899 Other long term (current) drug therapy: Secondary | ICD-10-CM

## 2024-01-09 DIAGNOSIS — J101 Influenza due to other identified influenza virus with other respiratory manifestations: Principal | ICD-10-CM | POA: Diagnosis present

## 2024-01-09 DIAGNOSIS — J111 Influenza due to unidentified influenza virus with other respiratory manifestations: Secondary | ICD-10-CM

## 2024-01-09 DIAGNOSIS — Z8419 Family history of other disorders of kidney and ureter: Secondary | ICD-10-CM

## 2024-01-09 DIAGNOSIS — R9389 Abnormal findings on diagnostic imaging of other specified body structures: Secondary | ICD-10-CM | POA: Insufficient documentation

## 2024-01-09 DIAGNOSIS — E875 Hyperkalemia: Secondary | ICD-10-CM | POA: Diagnosis present

## 2024-01-09 DIAGNOSIS — D631 Anemia in chronic kidney disease: Secondary | ICD-10-CM | POA: Diagnosis present

## 2024-01-09 DIAGNOSIS — Z7984 Long term (current) use of oral hypoglycemic drugs: Secondary | ICD-10-CM

## 2024-01-09 DIAGNOSIS — K219 Gastro-esophageal reflux disease without esophagitis: Secondary | ICD-10-CM | POA: Diagnosis present

## 2024-01-09 LAB — CBC WITH DIFFERENTIAL/PLATELET
Abs Immature Granulocytes: 0.02 K/uL (ref 0.00–0.07)
Basophils Absolute: 0 K/uL (ref 0.0–0.1)
Basophils Relative: 1 %
Eosinophils Absolute: 0 K/uL (ref 0.0–0.5)
Eosinophils Relative: 0 %
HCT: 31.8 % — ABNORMAL LOW (ref 36.0–46.0)
Hemoglobin: 9.5 g/dL — ABNORMAL LOW (ref 12.0–15.0)
Immature Granulocytes: 0 %
Lymphocytes Relative: 11 %
Lymphs Abs: 0.9 K/uL (ref 0.7–4.0)
MCH: 23.9 pg — ABNORMAL LOW (ref 26.0–34.0)
MCHC: 29.9 g/dL — ABNORMAL LOW (ref 30.0–36.0)
MCV: 79.9 fL — ABNORMAL LOW (ref 80.0–100.0)
Monocytes Absolute: 1.2 K/uL — ABNORMAL HIGH (ref 0.1–1.0)
Monocytes Relative: 16 %
Neutro Abs: 5.7 K/uL (ref 1.7–7.7)
Neutrophils Relative %: 72 %
Platelets: 163 K/uL (ref 150–400)
RBC: 3.98 MIL/uL (ref 3.87–5.11)
RDW: 17.1 % — ABNORMAL HIGH (ref 11.5–15.5)
WBC: 7.8 K/uL (ref 4.0–10.5)
nRBC: 0 % (ref 0.0–0.2)

## 2024-01-09 LAB — COMPREHENSIVE METABOLIC PANEL WITH GFR
ALT: 21 U/L (ref 0–44)
AST: 53 U/L — ABNORMAL HIGH (ref 15–41)
Albumin: 4.1 g/dL (ref 3.5–5.0)
Alkaline Phosphatase: 140 U/L — ABNORMAL HIGH (ref 38–126)
Anion gap: 10 (ref 5–15)
BUN: 35 mg/dL — ABNORMAL HIGH (ref 8–23)
CO2: 22 mmol/L (ref 22–32)
Calcium: 9.3 mg/dL (ref 8.9–10.3)
Chloride: 105 mmol/L (ref 98–111)
Creatinine, Ser: 2.63 mg/dL — ABNORMAL HIGH (ref 0.44–1.00)
GFR, Estimated: 18 mL/min — ABNORMAL LOW
Glucose, Bld: 90 mg/dL (ref 70–99)
Potassium: 4.4 mmol/L (ref 3.5–5.1)
Sodium: 137 mmol/L (ref 135–145)
Total Bilirubin: 0.3 mg/dL (ref 0.0–1.2)
Total Protein: 7.4 g/dL (ref 6.5–8.1)

## 2024-01-09 LAB — CBG MONITORING, ED
Glucose-Capillary: 52 mg/dL — ABNORMAL LOW (ref 70–99)
Glucose-Capillary: 127 mg/dL — ABNORMAL HIGH (ref 70–99)
Glucose-Capillary: 70 mg/dL (ref 70–99)
Glucose-Capillary: 44 mg/dL — CL (ref 70–99)
Glucose-Capillary: 46 mg/dL — ABNORMAL LOW (ref 70–99)
Glucose-Capillary: 52 mg/dL — ABNORMAL LOW (ref 70–99)
Glucose-Capillary: 44 mg/dL — CL (ref 70–99)

## 2024-01-09 LAB — URINALYSIS, W/ REFLEX TO CULTURE (INFECTION SUSPECTED)
Bacteria, UA: NONE SEEN
Bilirubin Urine: NEGATIVE
Glucose, UA: NEGATIVE mg/dL
Ketones, ur: NEGATIVE mg/dL
Leukocytes,Ua: NEGATIVE
Nitrite: NEGATIVE
Protein, ur: >=300 mg/dL — AB
Specific Gravity, Urine: 1.011 (ref 1.005–1.030)
pH: 5 (ref 5.0–8.0)

## 2024-01-09 LAB — RESP PANEL BY RT-PCR (RSV, FLU A&B, COVID)  RVPGX2
Influenza A by PCR: POSITIVE — AB
Influenza B by PCR: NEGATIVE
Resp Syncytial Virus by PCR: NEGATIVE
SARS Coronavirus 2 by RT PCR: NEGATIVE

## 2024-01-09 MED ORDER — DEXTROSE 50 % IV SOLN
12.5000 g | Freq: Once | INTRAVENOUS | Status: AC
  Administered 2024-01-09: 12.5 g via INTRAVENOUS
  Filled 2024-01-09: qty 50

## 2024-01-09 MED ORDER — MENTHOL 3 MG MT LOZG
1.0000 | LOZENGE | OROMUCOSAL | Status: DC | PRN
  Filled 2024-01-09: qty 9

## 2024-01-09 MED ORDER — AMLODIPINE BESYLATE 5 MG PO TABS
5.0000 mg | ORAL_TABLET | Freq: Every day | ORAL | Status: DC
  Administered 2024-01-10 – 2024-01-16 (×7): 5 mg via ORAL
  Filled 2024-01-09 (×7): qty 1

## 2024-01-09 MED ORDER — ADULT MULTIVITAMIN LIQUID CH
15.0000 mL | Freq: Every day | ORAL | Status: DC
  Administered 2024-01-11 – 2024-01-19 (×7): 15 mL via ORAL
  Filled 2024-01-09 (×10): qty 15

## 2024-01-09 MED ORDER — LOSARTAN POTASSIUM-HCTZ 100-25 MG PO TABS: 1.0000 | ORAL_TABLET | Freq: Every day | ORAL | Status: DC

## 2024-01-09 MED ORDER — BISACODYL 5 MG PO TBEC: 5.0000 mg | DELAYED_RELEASE_TABLET | Freq: Every day | ORAL | Status: DC | PRN

## 2024-01-09 MED ORDER — HEPARIN SODIUM (PORCINE) 5000 UNIT/ML IJ SOLN
5000.0000 [IU] | Freq: Three times a day (TID) | INTRAMUSCULAR | Status: DC
  Administered 2024-01-09 – 2024-01-19 (×30): 5000 [IU] via SUBCUTANEOUS
  Filled 2024-01-09 (×30): qty 1

## 2024-01-09 MED ORDER — SODIUM CHLORIDE 0.9 % IV BOLUS
1000.0000 mL | Freq: Once | INTRAVENOUS | Status: AC
  Administered 2024-01-09: 1000 mL via INTRAVENOUS

## 2024-01-09 MED ORDER — SENNOSIDES-DOCUSATE SODIUM 8.6-50 MG PO TABS: 1.0000 | ORAL_TABLET | Freq: Every evening | ORAL | Status: DC | PRN

## 2024-01-09 MED ORDER — DOXAZOSIN MESYLATE 4 MG PO TABS
4.0000 mg | ORAL_TABLET | Freq: Two times a day (BID) | ORAL | Status: DC
  Administered 2024-01-10 – 2024-01-19 (×19): 4 mg via ORAL
  Filled 2024-01-09 (×21): qty 1

## 2024-01-09 MED ORDER — CARVEDILOL 25 MG PO TABS
25.0000 mg | ORAL_TABLET | Freq: Two times a day (BID) | ORAL | Status: DC
  Administered 2024-01-09 – 2024-01-19 (×20): 25 mg via ORAL
  Filled 2024-01-09 (×16): qty 1
  Filled 2024-01-09 (×2): qty 2
  Filled 2024-01-09 (×2): qty 1

## 2024-01-09 MED ORDER — FERROUS SULFATE 325 (65 FE) MG PO TABS
325.0000 mg | ORAL_TABLET | ORAL | Status: DC
  Administered 2024-01-10 – 2024-01-18 (×4): 325 mg via ORAL
  Filled 2024-01-09 (×5): qty 1

## 2024-01-09 MED ORDER — LOSARTAN POTASSIUM 50 MG PO TABS
100.0000 mg | ORAL_TABLET | Freq: Every day | ORAL | Status: DC
  Administered 2024-01-10: 100 mg via ORAL
  Filled 2024-01-09: qty 2

## 2024-01-09 MED ORDER — PANTOPRAZOLE SODIUM 40 MG PO TBEC
40.0000 mg | DELAYED_RELEASE_TABLET | Freq: Every day | ORAL | Status: DC
  Administered 2024-01-10 – 2024-01-18 (×10): 40 mg via ORAL
  Filled 2024-01-09 (×11): qty 1

## 2024-01-09 MED ORDER — DM-GUAIFENESIN ER 30-600 MG PO TB12
1.0000 | ORAL_TABLET | Freq: Two times a day (BID) | ORAL | Status: DC
  Administered 2024-01-09 – 2024-01-19 (×18): 1 via ORAL
  Filled 2024-01-09 (×19): qty 1

## 2024-01-09 MED ORDER — ONDANSETRON HCL 4 MG/2ML IJ SOLN
4.0000 mg | Freq: Four times a day (QID) | INTRAMUSCULAR | Status: DC | PRN
  Administered 2024-01-10 – 2024-01-16 (×9): 4 mg via INTRAVENOUS
  Filled 2024-01-09 (×10): qty 2

## 2024-01-09 MED ORDER — BENZONATATE 100 MG PO CAPS
100.0000 mg | ORAL_CAPSULE | Freq: Once | ORAL | Status: AC
  Administered 2024-01-09: 100 mg via ORAL
  Filled 2024-01-09: qty 1

## 2024-01-09 MED ORDER — ONDANSETRON HCL 4 MG PO TABS
4.0000 mg | ORAL_TABLET | Freq: Four times a day (QID) | ORAL | Status: DC | PRN
  Administered 2024-01-13 – 2024-01-14 (×3): 4 mg via ORAL
  Filled 2024-01-09 (×3): qty 1

## 2024-01-09 MED ORDER — SERTRALINE HCL 50 MG PO TABS
50.0000 mg | ORAL_TABLET | Freq: Every day | ORAL | Status: DC
  Administered 2024-01-10 – 2024-01-18 (×10): 50 mg via ORAL
  Filled 2024-01-09 (×10): qty 1

## 2024-01-09 MED ORDER — TIMOLOL MALEATE 0.25 % OP SOLN
1.0000 [drp] | Freq: Every morning | OPHTHALMIC | Status: DC
  Administered 2024-01-11 – 2024-01-19 (×9): 1 [drp] via OPHTHALMIC
  Filled 2024-01-09: qty 5

## 2024-01-09 MED ORDER — DEXTROSE 10 % IV SOLN: INTRAVENOUS | Status: DC

## 2024-01-09 MED ORDER — ACETAMINOPHEN 650 MG RE SUPP: 650.0000 mg | Freq: Four times a day (QID) | RECTAL | Status: DC | PRN

## 2024-01-09 MED ORDER — ATORVASTATIN CALCIUM 40 MG PO TABS
40.0000 mg | ORAL_TABLET | Freq: Every day | ORAL | Status: DC
  Administered 2024-01-10 – 2024-01-18 (×10): 40 mg via ORAL
  Filled 2024-01-09 (×10): qty 1

## 2024-01-09 MED ORDER — ACETAMINOPHEN 500 MG PO TABS
1000.0000 mg | ORAL_TABLET | Freq: Once | ORAL | Status: AC
  Administered 2024-01-09: 1000 mg via ORAL
  Filled 2024-01-09: qty 2

## 2024-01-09 MED ORDER — LATANOPROST 0.005 % OP SOLN
1.0000 [drp] | Freq: Every day | OPHTHALMIC | Status: DC
  Administered 2024-01-10 – 2024-01-18 (×9): 1 [drp] via OPHTHALMIC
  Filled 2024-01-09: qty 2.5

## 2024-01-09 MED ORDER — DEXTROSE 50 % IV SOLN
1.0000 | Freq: Once | INTRAVENOUS | Status: AC
  Administered 2024-01-09: 50 mL via INTRAVENOUS
  Filled 2024-01-09: qty 50

## 2024-01-09 MED ORDER — OSELTAMIVIR PHOSPHATE 30 MG PO CAPS
30.0000 mg | ORAL_CAPSULE | Freq: Once | ORAL | Status: AC
  Administered 2024-01-09: 30 mg via ORAL
  Filled 2024-01-09: qty 1

## 2024-01-09 MED ORDER — ACETAMINOPHEN 325 MG PO TABS
650.0000 mg | ORAL_TABLET | Freq: Four times a day (QID) | ORAL | Status: DC | PRN
  Administered 2024-01-10 – 2024-01-18 (×8): 650 mg via ORAL
  Filled 2024-01-09 (×8): qty 2

## 2024-01-09 MED ORDER — HYDROCHLOROTHIAZIDE 25 MG PO TABS
25.0000 mg | ORAL_TABLET | Freq: Every day | ORAL | Status: DC
  Administered 2024-01-10: 25 mg via ORAL
  Filled 2024-01-09: qty 1

## 2024-01-09 MED ORDER — HYDRALAZINE HCL 20 MG/ML IJ SOLN
10.0000 mg | Freq: Three times a day (TID) | INTRAMUSCULAR | Status: DC | PRN
  Administered 2024-01-10 – 2024-01-11 (×2): 10 mg via INTRAVENOUS
  Filled 2024-01-09 (×2): qty 1

## 2024-01-09 NOTE — ED Provider Notes (Signed)
 " [place] [facility] AT [facility] Provider Note  CSN: [id number] Arrival date & time: 01/09/24 8397  Chief Complaint(s) No chief complaint on file.  HPI Madison Edwards is a 78 y.o. female history of CKD, diabetes, hypertension, presenting to the emergency department with hypoglycemia.  Patient was in bed, husband found her unresponsive, called paramedics.  CBG was 33, received D10 with improvement.  Has been feeling very weak for the last few days, husband has been sick with cough.  She is also having sore throat, runny nose.  No chest pain, back pain.  She took her medications last night but none today.  Having some fevers at home as well.  No productive cough.  No abdominal pain.   Past Medical History Past Medical History:  Diagnosis Date   Diabetes mellitus without complication (HCC)    Hypertension    Patient Active Problem List   Diagnosis Date Noted   Influenza A 01/09/2024   Current moderate episode of major depressive disorder without prior episode (HCC) 09/20/2023   Type 2 diabetes mellitus with hyperglycemia, without long-term current use of insulin (HCC) 09/20/2023   Primary hypertension 09/20/2023   Vitamin D  deficiency 09/20/2023   CKD (chronic kidney disease) stage 4, GFR 15-29 ml/min (HCC) 06/14/2023   Obesity, morbid (HCC) 06/14/2023   Home Medication(s) Prior to Admission medications  Medication Sig Start Date End Date Taking? Authorizing Provider  ACETAMINOPHEN  PO Take 500 mg by mouth as needed (take 1,000).    [provider]  AMLODIPINE BESYLATE PO Take 10 mg by mouth daily.    [provider]  atorvastatin (LIPITOR) 40 MG tablet Take 40 mg by mouth at bedtime. 05/11/23   [provider]  b complex vitamins capsule Take 1 capsule by mouth daily.    [provider]  Biotin 5 MG TABS Take 1 tablet by mouth daily.    [provider]  carvedilol  (COREG ) 25 MG tablet Take 25 mg by mouth 2 (two)  times daily. 04/07/23   [provider]  Continuous Glucose Sensor (FREESTYLE LIBRE 3 SENSOR) MISC Place 1 sensor on the skin every 14 days. Use to check glucose continuously 08/16/23   Ngetich, Dinah C, NP  DOXAZOSIN MESYLATE PO Take 2 mg by mouth at bedtime.    [provider]  ferrous sulfate 325 (65 FE) MG tablet Take 325 mg by mouth every other day.    [provider]  fluticasone  (FLONASE ) 50 MCG/ACT nasal spray PLACE 2 SPRAYS INTO BOTH NOSTRILS DAILY AS NEEDED FOR ALLERGIES OR RHINITIS (ALLERGIES). 09/12/23   Ngetich, Dinah C, NP  glimepiride (AMARYL) 4 MG tablet Take 4 mg by mouth daily. 06/07/23   [provider]  latanoprost (XALATAN) 0.005 % ophthalmic solution 1 drop at bedtime. 04/16/23   [provider]  losartan-hydrochlorothiazide (HYZAAR) 100-25 MG tablet Take 1 tablet by mouth daily. 05/19/23   [provider]  Multiple Vitamins-Minerals (MULTIVITAMIN & MINERAL PO) Take 1 tablet by mouth daily.    [provider]  pantoprazole  (PROTONIX ) 40 MG tablet Take 1 tablet (40 mg total) by mouth daily. 09/20/23   Ngetich, Dinah C, NP  sertraline  (ZOLOFT ) 50 MG tablet Take 1 tablet (50 mg total) by mouth daily. 09/20/23   Ngetich, Dinah C, NP  timolol (TIMOPTIC) 0.25 % ophthalmic solution SMARTSIG:In Eye(s) 06/10/23   [provider]  tirzepatide  (ZEPBOUND ) 2.5 MG/0.5ML injection vial Inject 2.5 mg into the skin once a week. 09/20/23  Ngetich, Dinah C, NP  Vitamin D , Ergocalciferol , (DRISDOL ) 1.25 MG (50000 UNIT) CAPS capsule TAKE 1 CAPSULE (50,000 UNITS TOTAL) BY MOUTH EVERY 7 (SEVEN) DAYS 11/24/23   Ngetich, Dinah C, NP  vitamin E 1000 UNIT capsule Take 1,000 Units by mouth daily.    [provider]                                                                                                                                    Past Surgical History Past Surgical History:  Procedure Laterality Date   COLONOSCOPY  12/2008    Family History Family History  Problem Relation Age of Onset   Heart attack Mother    Prostate cancer Father    Stroke Sister    Hypertension Sister    Stroke Sister    Arthritis Sister    Hypertension Sister    Hypertension Brother    Cancer Brother    Kidney disease Brother    Diabetes Brother    Cancer Brother    Liver disease Brother     Social History Social History[1] Allergies Dust mite extract and Pollen extract  Review of Systems Review of Systems  All other systems reviewed and are negative.   Physical Exam Vital Signs  I have reviewed the triage vital signs BP (!) 214/121   Pulse 77   Temp 98.6 F (37 C)   Resp 17   SpO2 100%  Physical Exam Vitals and nursing note reviewed.  Constitutional:      General: She is not in acute distress.    Appearance: She is well-developed. She is obese.  HENT:     Head: Normocephalic and atraumatic.     Mouth/Throat:     Mouth: Mucous membranes are dry.  Eyes:     Pupils: Pupils are equal, round, and reactive to light.  Cardiovascular:     Rate and Rhythm: Normal rate and regular rhythm.     Heart sounds: No murmur heard. Pulmonary:     Effort: Pulmonary effort is normal. No respiratory distress.     Breath sounds: Normal breath sounds.  Abdominal:     General: Abdomen is flat.     Palpations: Abdomen is soft.     Tenderness: There is no abdominal tenderness.  Musculoskeletal:        General: No tenderness.     Right lower leg: No edema.     Left lower leg: No edema.  Skin:    General: Skin is warm and dry.  Neurological:     General: No focal deficit present.     Mental Status: She is alert. Mental status is at baseline.  Psychiatric:        Mood and Affect: Mood normal.        Behavior: Behavior normal.     ED Results and Treatments Labs (all labs ordered are listed, but only abnormal results are displayed) Labs  Reviewed  RESP PANEL BY RT-PCR (RSV, FLU A&B, COVID)  RVPGX2 - Abnormal; Notable  for the following components:      Result Value   Influenza A by PCR POSITIVE (*)    All other components within normal limits  COMPREHENSIVE METABOLIC PANEL WITH GFR - Abnormal; Notable for the following components:   BUN 35 (*)    Creatinine, Ser 2.63 (*)    AST 53 (*)    Alkaline Phosphatase 140 (*)    GFR, Estimated 18 (*)    All other components within normal limits  CBC WITH DIFFERENTIAL/PLATELET - Abnormal; Notable for the following components:   Hemoglobin 9.5 (*)    HCT 31.8 (*)    MCV 79.9 (*)    MCH 23.9 (*)    MCHC 29.9 (*)    RDW 17.1 (*)    Monocytes Absolute 1.2 (*)    All other components within normal limits  URINALYSIS, W/ REFLEX TO CULTURE (INFECTION SUSPECTED) - Abnormal; Notable for the following components:   Color, Urine STRAW (*)    Hgb urine dipstick SMALL (*)    Protein, ur >=300 (*)    All other components within normal limits  CBG MONITORING, ED - Abnormal; Notable for the following components:   Glucose-Capillary 52 (*)    All other components within normal limits  CBG MONITORING, ED - Abnormal; Notable for the following components:   Glucose-Capillary 127 (*)    All other components within normal limits  CBG MONITORING, ED - Abnormal; Notable for the following components:   Glucose-Capillary 44 (*)    All other components within normal limits  CBG MONITORING, ED - Abnormal; Notable for the following components:   Glucose-Capillary 46 (*)    All other components within normal limits  CBG MONITORING, ED                                                                                                                          Radiology DG Chest Port 1 View Result Date: 01/09/2024 EXAM: 1 VIEW(S) XRAY OF THE CHEST 01/09/2024 05:20:00 PM COMPARISON: 12/03/1999 CLINICAL HISTORY: weakness FINDINGS: LUNGS AND PLEURA: No focal pulmonary opacity. No pleural effusion. No pneumothorax. HEART AND MEDIASTINUM: Tortuous thoracic aorta. Aortic arch  calcifications. There is some soft tissue prominence in the upper right paratracheal region. Cardiac silhouette is within normal limits. BONES AND SOFT TISSUES: No acute osseous abnormality. IMPRESSION: 1. No acute cardiopulmonary abnormality. 2. Tortuous thoracic aorta with aortic arch atherosclerotic calcification. 3. Right paratracheal soft tissue prominence, which can be further evaluated with chest CT if not previously characterized. Electronically signed by: Greig Pique MD 01/09/2024 06:24 PM EST RP Workstation: [id number]    Pertinent labs & imaging results that were available during my care of the patient were reviewed by me and considered in my medical decision making (see MDM for details).  Medications Ordered in ED Medications  dextrose  10 % infusion ( Intravenous Infusion Verify 01/09/24 1842)  carvedilol  (  COREG ) tablet 25 mg (25 mg Oral Given 01/09/24 1759)  menthol (CEPACOL) lozenge 3 mg (has no administration in time range)  sodium chloride  0.9 % bolus 1,000 mL (1,000 mLs Intravenous New Bag/Given 01/09/24 1625)  dextrose  50 % solution 50 mL (50 mLs Intravenous Given 01/09/24 1626)  dextrose  50 % solution 12.5 g (12.5 g Intravenous Given 01/09/24 1742)  benzonatate  (TESSALON ) capsule 100 mg (100 mg Oral Given 01/09/24 1800)  oseltamivir  (TAMIFLU ) capsule 30 mg (30 mg Oral Given 01/09/24 1842)  dextrose  50 % solution 12.5 g (12.5 g Intravenous Given 01/09/24 1842)  acetaminophen  (TYLENOL ) tablet 1,000 mg (1,000 mg Oral Given 01/09/24 1842)                                                                                                                                     Procedures .Critical Care  Performed by: Francesca Elsie CROME, MD Authorized by: Francesca Elsie CROME, MD   Critical care provider statement:    Critical care time (minutes):  30   Critical care was necessary to treat or prevent imminent or life-threatening deterioration of the following conditions:  Metabolic  crisis   Critical care was time spent personally by me on the following activities:  Development of treatment plan with patient or surrogate, discussions with consultants, evaluation of patient's response to treatment, examination of patient, ordering and review of laboratory studies, ordering and review of radiographic studies, ordering and performing treatments and interventions, pulse oximetry, re-evaluation of patient's condition and review of old charts   Care discussed with: admitting provider     (including critical care time)  Medical Decision Making / ED Course   MDM:  78 year old presenting to the emergency department with recurrent hypoglycemia in the setting of influenza-like illness.  Her flu test is positive.  She reports she is not eating or drinking.  She is taking a sulfonylurea.  She did not take it today but did take it last night.  Suspect this is a combination of her viral infection, CKD, and sulfonylurea use.  She did improve with D50 but has required initiation of dextrose  infusion.  She will need admission.  Her laboratory testing is otherwise notable for chronic CKD.  No evidence of any acute underlying infectious process.  Discussed with the hospitalist who will admit the patient.      Additional history obtained: -Additional history obtained from ems -External records from outside source obtained and reviewed including: Chart review including previous notes, labs, imaging, consultation notes including prior notes    Lab Tests: -I ordered, reviewed, and interpreted labs.   The pertinent results include:   Labs Reviewed  RESP PANEL BY RT-PCR (RSV, FLU A&B, COVID)  RVPGX2 - Abnormal; Notable for the following components:      Result Value   Influenza A by PCR POSITIVE (*)    All other components within normal limits  COMPREHENSIVE METABOLIC PANEL WITH GFR -  Abnormal; Notable for the following components:   BUN 35 (*)    Creatinine, Ser 2.63 (*)    AST 53 (*)     Alkaline Phosphatase 140 (*)    GFR, Estimated 18 (*)    All other components within normal limits  CBC WITH DIFFERENTIAL/PLATELET - Abnormal; Notable for the following components:   Hemoglobin 9.5 (*)    HCT 31.8 (*)    MCV 79.9 (*)    MCH 23.9 (*)    MCHC 29.9 (*)    RDW 17.1 (*)    Monocytes Absolute 1.2 (*)    All other components within normal limits  URINALYSIS, W/ REFLEX TO CULTURE (INFECTION SUSPECTED) - Abnormal; Notable for the following components:   Color, Urine STRAW (*)    Hgb urine dipstick SMALL (*)    Protein, ur >=300 (*)    All other components within normal limits  CBG MONITORING, ED - Abnormal; Notable for the following components:   Glucose-Capillary 52 (*)    All other components within normal limits  CBG MONITORING, ED - Abnormal; Notable for the following components:   Glucose-Capillary 127 (*)    All other components within normal limits  CBG MONITORING, ED - Abnormal; Notable for the following components:   Glucose-Capillary 44 (*)    All other components within normal limits  CBG MONITORING, ED - Abnormal; Notable for the following components:   Glucose-Capillary 46 (*)    All other components within normal limits  CBG MONITORING, ED    Notable for hypoglycemia   EKG   EKG Interpretation Date/Time:  Tuesday January 09 2024 16:21:36 EST Ventricular Rate:  74 PR Interval:  165 QRS Duration:  93 QT Interval:  398 QTC Calculation: 442 R Axis:   5  Text Interpretation: Sinus rhythm Consider left atrial enlargement Abnormal R-wave progression, early transition Abnormal T, consider ischemia, lateral leads Confirmed by Francesca Fallow ([id number]) on 01/09/2024 4:46:30 PM         Imaging Studies ordered: I ordered imaging studies including CXR On my interpretation imaging demonstrates no acute process I independently visualized and interpreted imaging. I agree with the radiologist interpretation   Medicines ordered and prescription drug  management: Meds ordered this encounter  Medications   sodium chloride  0.9 % bolus 1,000 mL   dextrose  50 % solution 50 mL   dextrose  50 % solution 12.5 g   dextrose  10 % infusion   carvedilol  (COREG ) tablet 25 mg   menthol (CEPACOL) lozenge 3 mg   benzonatate  (TESSALON ) capsule 100 mg   oseltamivir  (TAMIFLU ) capsule 30 mg   dextrose  50 % solution 12.5 g   acetaminophen  (TYLENOL ) tablet 1,000 mg    -I have reviewed the patients home medicines and have made adjustments as needed   Consultations Obtained: I requested consultation with the hospitalist,  and discussed lab and imaging findings as well as pertinent plan - they recommend: admission   Cardiac Monitoring: The patient was maintained on a cardiac monitor.  I personally viewed and interpreted the cardiac monitored which showed an underlying rhythm of: NSR  Social Determinants of Health:  Diagnosis or treatment significantly limited by social determinants of health: obesity   Reevaluation: After the interventions noted above, I reevaluated the patient and found that their symptoms have improved  Co morbidities that complicate the patient evaluation  Past Medical History:  Diagnosis Date   Diabetes mellitus without complication (HCC)    Hypertension       Dispostion: Disposition decision  including need for hospitalization was considered, and patient admitted to the hospital.    Final Clinical Impression(s) / ED Diagnoses Final diagnoses:  Hypoglycemia  Influenza     This chart was dictated using voice recognition software.  Despite best efforts to proofread,  errors can occur which can change the documentation meaning.     [1]  Social History Tobacco Use   Smoking status: Never  Vaping Use   Vaping status: Never Used  Substance Use Topics   Alcohol use: Yes    Comment: occ 1 glass of wine every 2 months.   Drug use: Never     Francesca Elsie CROME, MD 01/09/24 [phone]  "

## 2024-01-09 NOTE — ED Triage Notes (Signed)
 Pt bib ems from home after pt husband found her to be unresponsive. On arrival CBG 33. Gave 10G D10, became alert, CBG 99. Since then, increasing lethargy. Down to 5. Started another bag of D10. Pt has been feeling sick the last few days. Hypertensive in the 200;s systolic.

## 2024-01-09 NOTE — ED Notes (Signed)
 RN aware blood sugar read 44

## 2024-01-09 NOTE — ED Notes (Incomplete)
 Per pt son pt assisted herself to the floor, her husband and son got her into the bed

## 2024-01-09 NOTE — H&P (Signed)
 " History and Physical  Madison Edwards FMW:[id number] DOB: [birth year] DOA: 01/09/2024  PCP: Madison Roxan BROCKS, NP   Chief Complaint: Unresponsive, hypoglycemia, generalized weakness  HPI: Madison Edwards is a 78 y.o. Edwards with medical history significant for T2DM, HTN, HLD, morbid obesity, CKD stage IV, GERD, IDA and and depression who presented to the ED for evaluation of hypoglycemia and generalized weakness. Per spouse, patient has had generalized weakness and malaise since Sunday. They had difficulty getting her out from the floor yesterday and today he found her laying in bed breathing heavy and seemed out of it so he called EMS. Patient reports that she has had sore throat, cold chills and cough with mucus production over the last 2 days. She had some soup on Sunday but has not had any food over the last 2 days due to poor appetite. She felt significantly weak today and was unable to get out of bed. She denies any fevers, abdominal pain, nausea, vomiting, shortness of breath, dizziness or chest pain.  She denies any falls or LOC.  She takes her meds daily and has not taken any of her medications today.  ED Course: Initial vitals show patient afebrile but hypertensive with SBP in the 210s. Initial labs significant for positive influenza A by PCR, BUNs/creatinine 35/2.63, CBG 44->46->52, Hgb 9.5. EKG shows sinus rhythm. CXR shows no active disease. Pt received Tylenol , Tessalon , Tamiflu , D50 x 2 and started on D10 infusion.TRH was consulted for admission.   Review of Systems: Please see HPI for pertinent positives and negatives. A complete 10 system review of systems are otherwise negative.  Past Medical History:  Diagnosis Date   Diabetes mellitus without complication (HCC)    Hypertension    Past Surgical History:  Procedure Laterality Date   COLONOSCOPY  12/2008   Social History:  reports that she has never smoked. She does not have any smokeless tobacco history on file. She reports  current alcohol use. She reports that she does not use drugs.  Allergies[1]  Family History  Problem Relation Age of Onset   Heart attack Mother    Prostate cancer Father    Stroke Sister    Hypertension Sister    Stroke Sister    Arthritis Sister    Hypertension Sister    Hypertension Brother    Cancer Brother    Kidney disease Brother    Diabetes Brother    Cancer Brother    Liver disease Brother      Prior to Admission medications  Medication Sig Start Date End Date Taking? Authorizing Provider  ACETAMINOPHEN  PO Take 500 mg by mouth as needed (take 1,000).    [provider]  AMLODIPINE BESYLATE PO Take 10 mg by mouth daily.    [provider]  atorvastatin (LIPITOR) 40 MG tablet Take 40 mg by mouth at bedtime. 05/11/23   [provider]  b complex vitamins capsule Take 1 capsule by mouth daily.    [provider]  Biotin 5 MG TABS Take 1 tablet by mouth daily.    [provider]  carvedilol  (COREG ) 25 MG tablet Take 25 mg by mouth 2 (two) times daily. 04/07/23   [provider]  Continuous Glucose Sensor (FREESTYLE LIBRE 3 SENSOR) MISC Place 1 sensor on the skin every 14 days. Use to check glucose continuously 08/16/23   Ngetich, Dinah C, NP  DOXAZOSIN MESYLATE PO Take 2 mg by mouth at bedtime.    [provider]  ferrous  sulfate 325 (65 FE) MG tablet Take 325 mg by mouth every other day.    [provider]  fluticasone  (FLONASE ) 50 MCG/ACT nasal spray PLACE 2 SPRAYS INTO BOTH NOSTRILS DAILY AS NEEDED FOR ALLERGIES OR RHINITIS (ALLERGIES). 09/12/23   Ngetich, Dinah C, NP  glimepiride (AMARYL) 4 MG tablet Take 4 mg by mouth daily. 06/07/23   [provider]  latanoprost (XALATAN) 0.005 % ophthalmic solution 1 drop at bedtime. 04/16/23   [provider]  losartan-hydrochlorothiazide (HYZAAR) 100-25 MG tablet Take 1 tablet by mouth daily. 05/19/23   [provider]  Multiple Vitamins-Minerals  (MULTIVITAMIN & MINERAL PO) Take 1 tablet by mouth daily.    [provider]  pantoprazole  (PROTONIX ) 40 MG tablet Take 1 tablet (40 mg total) by mouth daily. 09/20/23   Ngetich, Dinah C, NP  sertraline  (ZOLOFT ) 50 MG tablet Take 1 tablet (50 mg total) by mouth daily. 09/20/23   Ngetich, Dinah C, NP  timolol (TIMOPTIC) 0.25 % ophthalmic solution SMARTSIG:In Eye(s) 06/10/23   [provider]  tirzepatide  (ZEPBOUND ) 2.5 MG/0.5ML injection vial Inject 2.5 mg into the skin once a week. 09/20/23   Ngetich, Dinah C, NP  Vitamin D , Ergocalciferol , (DRISDOL ) 1.25 MG (50000 UNIT) CAPS capsule TAKE 1 CAPSULE (50,000 UNITS TOTAL) BY MOUTH EVERY 7 (SEVEN) DAYS 11/24/23   Ngetich, Dinah C, NP  vitamin E 1000 UNIT capsule Take 1,000 Units by mouth daily.    [provider]    Physical Exam: BP (!) 214/121   Pulse 77   Temp 98.6 F (37 C)   Resp 17   SpO2 100%  General: Pleasant, weak appearing obese elderly woman laying in bed. No acute distress. HEENT: [place]/AT. Anicteric sclera CV: RRR. No murmurs, rubs, or gallops. No LE edema Pulmonary: Lungs CTAB. Normal effort. No wheezing or rales. Decreased breath sounds throughout. Abdominal: Soft, nontender, nondistended. Normal bowel sounds. Extremities: Palpable radial and DP pulses. Normal ROM. Skin: Warm and dry. No obvious rash or lesions. Neuro: A&Ox3. Generalized weakness.  Moves all extremities. Normal sensation to light touch. No focal deficit. Psych: Normal mood and affect          Labs on Admission:  Basic Metabolic Panel: Recent Labs  Lab 01/09/24 1648  NA 137  K 4.4  CL 105  CO2 22  GLUCOSE 90  BUN 35*  CREATININE 2.63*  CALCIUM 9.3   Liver Function Tests: Recent Labs  Lab 01/09/24 1648  AST 53*  ALT 21  ALKPHOS 140*  BILITOT 0.3  PROT 7.4  ALBUMIN 4.1   No results for input(s): LIPASE, AMYLASE in the last 168 hours. No results for input(s): AMMONIA in the last 168 hours. CBC: Recent Labs   Lab 01/09/24 1648  WBC 7.8  NEUTROABS 5.7  HGB 9.5*  HCT 31.8*  MCV 79.9*  PLT 163   Cardiac Enzymes: No results for input(s): CKTOTAL, CKMB, CKMBINDEX, TROPONINI in the last 168 hours. BNP (last 3 results) No results for input(s): BNP in the last 8760 hours.  ProBNP (last 3 results) No results for input(s): PROBNP in the last 8760 hours.  CBG: Recent Labs  Lab 01/09/24 1648 01/09/24 1715 01/09/24 1736 01/09/24 1833 01/09/24 1954  GLUCAP 127* 70 44* 46* 52*    Radiological Exams on Admission: DG Chest Port 1 View Result Date: 01/09/2024 EXAM: 1 VIEW(S) XRAY OF THE CHEST 01/09/2024 05:20:00 PM COMPARISON: 12/03/1999 CLINICAL HISTORY: weakness FINDINGS: LUNGS AND PLEURA: No focal pulmonary opacity. No pleural effusion. No pneumothorax.  HEART AND MEDIASTINUM: Tortuous thoracic aorta. Aortic arch calcifications. There is some soft tissue prominence in the upper right paratracheal region. Cardiac silhouette is within normal limits. BONES AND SOFT TISSUES: No acute osseous abnormality. IMPRESSION: 1. No acute cardiopulmonary abnormality. 2. Tortuous thoracic aorta with aortic arch atherosclerotic calcification. 3. Right paratracheal soft tissue prominence, which can be further evaluated with chest CT if not previously characterized. Electronically signed by: Greig Pique MD 01/09/2024 06:24 PM EST RP Workstation: [id number]   Assessment/Plan Madison Edwards is a 78 y.o. Edwards with medical history significant for T2DM, HTN, HLD, morbid obesity, CKD stage IV, GERD, IDA and and depression who presented to the ED for evaluation of hypoglycemia and generalized weakness and admitted for influenza A infection  # Influenza A infection - Patient presented with 2 days of worsening weakness and malaise with associated productive cough and cold chills - Found to have positive influenza A by PCR, CXR without signs of pneumonia - Continue Tamiflu  30 mg twice daily for 5 days  (reduced dose based on kidney function) - Symptomatic management with IV hydration, scheduled Mucinex  and as needed Tylenol  - Droplet precautions  # Hypoglycemia # Hx of T2DM - Patient with history of type 2 diabetes on glimepiride only found to be slightly unresponsive at home with initial CBG of 33 by EMS - Hypoglycemia likely secondary to patient not eating for the last 2 days but still taking her glimepiride - Patient remains alert and oriented on my evaluation but has persistent hypoglycemia with CBGs in the 40s to 50s after D50 x 2 in the ER - Will increase D10 infusion from 100 to 125 cc/h with close monitoring of CBGs - Patient encouraged to eat, started on a diet - Follow-up repeat A1c - Hold glimepiride for now  # Hypertensive urgency - BP significantly elevated to 210/120-130s on admission in the setting of patient not taking her BP meds today - 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 - Continue losartan, HCTZ, Coreg , amlodipine and Cardura - IV hydralazine as needed for SBP > 180  # CKD stage IV - Creatinine of 2.63 on admission, unclear recent baseline as her last creatinine was 2.7 2 weeks ago and 1.23 9 years ago - IV hydration as above - Trend renal function, and avoid nephrotoxic agents  # Right paratracheal soft tissue prominence  - CXR identified a right paratracheal soft tissue prominence, CT chest recommended for further characterization - Can consider CT chest inpatient after further improvement in kidney function versus outpatient imaging   # HLD - Continue atorvastatin  # GERD - Continue Protonix   # Depression - Continue sertraline   # Generalized weakness - In the setting of acute illness - PT/OT eval and treat  # Morbid obesity - BMI of 46.0 based on last weight of 119.7 kg - Follow-up with PCP for nutrition and weight loss counseling - Check weight during this hospitalization  DVT prophylaxis: Heparin     Code Status:  Full Code  Consults called: None  Family Communication: Discussed results/findings and plan for admission with spouse and son at bedside  Severity of Illness: The appropriate patient status for this patient is OBSERVATION. Observation status is judged to be reasonable and necessary in order to provide the required intensity of service to ensure the patient's safety. The patient's presenting symptoms, physical exam findings, and initial radiographic and laboratory data in the context of their medical condition is felt to place them at decreased risk for  further clinical deterioration. Furthermore, it is anticipated that the patient will be medically stable for discharge from the hospital within 2 midnights of admission.   Level of care: Telemetry   I personally spent a total of 75 minutes in the care of the patient today including preparing to see the patient, getting/reviewing separately obtained history, performing a medically appropriate exam/evaluation, placing orders, documenting clinical information in the EHR, and communicating results.   Madison Claretta HERO, MD 01/09/2024, 8:13 PM [facility] Pager: [phone] Isaiah 41:10   If 7PM-7AM, please contact night-coverage [email or web address] Password [email or web address]     [1]  Allergies Allergen Reactions   Dust Mite Extract Other (See Comments)    Sneezing and Headaches   Pollen Extract Other (See Comments)    Sneezing and Headaches    "

## 2024-01-09 NOTE — ED Notes (Signed)
 Nurse entered room to check on pt, IV pump silenced and blinking occluded. Family unsure how long IV fluids were paused, family member stated a staff member instructed them to silence IV pump. Nurse educated pt and family to push call button each time pump beeps to alert nursing staff instead of pushing silence button. MD made aware

## 2024-01-09 NOTE — ED Notes (Signed)
 MD notified of CBG result.

## 2024-01-10 DIAGNOSIS — N184 Chronic kidney disease, stage 4 (severe): Secondary | ICD-10-CM | POA: Diagnosis present

## 2024-01-10 DIAGNOSIS — I160 Hypertensive urgency: Secondary | ICD-10-CM

## 2024-01-10 DIAGNOSIS — Z7985 Long-term (current) use of injectable non-insulin antidiabetic drugs: Secondary | ICD-10-CM | POA: Diagnosis not present

## 2024-01-10 DIAGNOSIS — J101 Influenza due to other identified influenza virus with other respiratory manifestations: Secondary | ICD-10-CM | POA: Diagnosis present

## 2024-01-10 DIAGNOSIS — E162 Hypoglycemia, unspecified: Principal | ICD-10-CM

## 2024-01-10 DIAGNOSIS — Z8261 Family history of arthritis: Secondary | ICD-10-CM | POA: Diagnosis not present

## 2024-01-10 DIAGNOSIS — E1122 Type 2 diabetes mellitus with diabetic chronic kidney disease: Secondary | ICD-10-CM | POA: Diagnosis present

## 2024-01-10 DIAGNOSIS — E782 Mixed hyperlipidemia: Secondary | ICD-10-CM | POA: Diagnosis present

## 2024-01-10 DIAGNOSIS — Z6841 Body Mass Index (BMI) 40.0 and over, adult: Secondary | ICD-10-CM | POA: Diagnosis not present

## 2024-01-10 DIAGNOSIS — N179 Acute kidney failure, unspecified: Secondary | ICD-10-CM | POA: Diagnosis present

## 2024-01-10 DIAGNOSIS — E871 Hypo-osmolality and hyponatremia: Secondary | ICD-10-CM | POA: Diagnosis present

## 2024-01-10 DIAGNOSIS — E11649 Type 2 diabetes mellitus with hypoglycemia without coma: Secondary | ICD-10-CM

## 2024-01-10 DIAGNOSIS — F32A Depression, unspecified: Secondary | ICD-10-CM | POA: Diagnosis present

## 2024-01-10 DIAGNOSIS — D631 Anemia in chronic kidney disease: Secondary | ICD-10-CM | POA: Diagnosis present

## 2024-01-10 DIAGNOSIS — E875 Hyperkalemia: Secondary | ICD-10-CM | POA: Diagnosis present

## 2024-01-10 DIAGNOSIS — K219 Gastro-esophageal reflux disease without esophagitis: Secondary | ICD-10-CM | POA: Diagnosis present

## 2024-01-10 DIAGNOSIS — L89312 Pressure ulcer of right buttock, stage 2: Secondary | ICD-10-CM | POA: Diagnosis not present

## 2024-01-10 DIAGNOSIS — R531 Weakness: Secondary | ICD-10-CM | POA: Diagnosis present

## 2024-01-10 DIAGNOSIS — Z8249 Family history of ischemic heart disease and other diseases of the circulatory system: Secondary | ICD-10-CM | POA: Diagnosis not present

## 2024-01-10 DIAGNOSIS — G9341 Metabolic encephalopathy: Secondary | ICD-10-CM | POA: Diagnosis present

## 2024-01-10 DIAGNOSIS — Z7984 Long term (current) use of oral hypoglycemic drugs: Secondary | ICD-10-CM | POA: Diagnosis not present

## 2024-01-10 DIAGNOSIS — E1165 Type 2 diabetes mellitus with hyperglycemia: Secondary | ICD-10-CM | POA: Diagnosis not present

## 2024-01-10 DIAGNOSIS — I129 Hypertensive chronic kidney disease with stage 1 through stage 4 chronic kidney disease, or unspecified chronic kidney disease: Secondary | ICD-10-CM | POA: Diagnosis present

## 2024-01-10 DIAGNOSIS — Z1152 Encounter for screening for COVID-19: Secondary | ICD-10-CM | POA: Diagnosis not present

## 2024-01-10 DIAGNOSIS — E861 Hypovolemia: Secondary | ICD-10-CM | POA: Diagnosis present

## 2024-01-10 DIAGNOSIS — E66813 Obesity, class 3: Secondary | ICD-10-CM | POA: Diagnosis present

## 2024-01-10 LAB — CBG MONITORING, ED
Glucose-Capillary: 153 mg/dL — ABNORMAL HIGH (ref 70–99)
Glucose-Capillary: 31 mg/dL — CL (ref 70–99)
Glucose-Capillary: 34 mg/dL — CL (ref 70–99)
Glucose-Capillary: 35 mg/dL — CL (ref 70–99)
Glucose-Capillary: 36 mg/dL — CL (ref 70–99)
Glucose-Capillary: 39 mg/dL — CL (ref 70–99)
Glucose-Capillary: 44 mg/dL — CL (ref 70–99)
Glucose-Capillary: 55 mg/dL — ABNORMAL LOW (ref 70–99)
Glucose-Capillary: 70 mg/dL (ref 70–99)
Glucose-Capillary: 72 mg/dL (ref 70–99)
Glucose-Capillary: 75 mg/dL (ref 70–99)
Glucose-Capillary: 77 mg/dL (ref 70–99)
Glucose-Capillary: 82 mg/dL (ref 70–99)
Glucose-Capillary: 83 mg/dL (ref 70–99)
Glucose-Capillary: 87 mg/dL (ref 70–99)

## 2024-01-10 LAB — COMPREHENSIVE METABOLIC PANEL WITH GFR
ALT: 18 U/L (ref 0–44)
AST: 45 U/L — ABNORMAL HIGH (ref 15–41)
Albumin: 3.5 g/dL (ref 3.5–5.0)
Alkaline Phosphatase: 116 U/L (ref 38–126)
Anion gap: 9 (ref 5–15)
BUN: 36 mg/dL — ABNORMAL HIGH (ref 8–23)
CO2: 22 mmol/L (ref 22–32)
Calcium: 8.6 mg/dL — ABNORMAL LOW (ref 8.9–10.3)
Chloride: 105 mmol/L (ref 98–111)
Creatinine, Ser: 2.86 mg/dL — ABNORMAL HIGH (ref 0.44–1.00)
GFR, Estimated: 16 mL/min — ABNORMAL LOW
Glucose, Bld: 85 mg/dL (ref 70–99)
Potassium: 4.2 mmol/L (ref 3.5–5.1)
Sodium: 136 mmol/L (ref 135–145)
Total Bilirubin: 0.2 mg/dL (ref 0.0–1.2)
Total Protein: 6.3 g/dL — ABNORMAL LOW (ref 6.5–8.1)

## 2024-01-10 LAB — CBC
HCT: 28.4 % — ABNORMAL LOW (ref 36.0–46.0)
Hemoglobin: 8.5 g/dL — ABNORMAL LOW (ref 12.0–15.0)
MCH: 23.9 pg — ABNORMAL LOW (ref 26.0–34.0)
MCHC: 29.9 g/dL — ABNORMAL LOW (ref 30.0–36.0)
MCV: 79.8 fL — ABNORMAL LOW (ref 80.0–100.0)
Platelets: 147 K/uL — ABNORMAL LOW (ref 150–400)
RBC: 3.56 MIL/uL — ABNORMAL LOW (ref 3.87–5.11)
RDW: 17 % — ABNORMAL HIGH (ref 11.5–15.5)
WBC: 7.3 K/uL (ref 4.0–10.5)
nRBC: 0 % (ref 0.0–0.2)

## 2024-01-10 LAB — HEMOGLOBIN A1C
Hgb A1c MFr Bld: 6.4 % — ABNORMAL HIGH (ref 4.8–5.6)
Mean Plasma Glucose: 136.98 mg/dL

## 2024-01-10 LAB — GLUCOSE, CAPILLARY: Glucose-Capillary: 184 mg/dL — ABNORMAL HIGH (ref 70–99)

## 2024-01-10 MED ORDER — DEXTROSE 50 % IV SOLN
25.0000 g | INTRAVENOUS | Status: AC
  Administered 2024-01-10: 25 g via INTRAVENOUS
  Filled 2024-01-10: qty 50

## 2024-01-10 MED ORDER — GLUCOSE 4 G PO CHEW
1.0000 | CHEWABLE_TABLET | Freq: Once | ORAL | Status: AC
  Administered 2024-01-10: 4 g via ORAL
  Filled 2024-01-10: qty 1

## 2024-01-10 MED ORDER — GLUCERNA SHAKE PO LIQD
237.0000 mL | Freq: Once | ORAL | Status: AC
  Administered 2024-01-10: 237 mL via ORAL
  Filled 2024-01-10: qty 237

## 2024-01-10 MED ORDER — OSELTAMIVIR PHOSPHATE 30 MG PO CAPS
30.0000 mg | ORAL_CAPSULE | Freq: Two times a day (BID) | ORAL | Status: DC
  Administered 2024-01-10 (×2): 30 mg via ORAL
  Filled 2024-01-10 (×3): qty 1

## 2024-01-10 MED ORDER — DICLOFENAC SODIUM 1 % EX GEL
2.0000 g | Freq: Four times a day (QID) | CUTANEOUS | Status: DC
  Administered 2024-01-10 – 2024-01-19 (×25): 2 g via TOPICAL
  Filled 2024-01-10 (×2): qty 100

## 2024-01-10 MED ORDER — DEXTROSE 50 % IV SOLN
1.0000 | Freq: Once | INTRAVENOUS | Status: AC | PRN
  Administered 2024-01-10: 50 mL via INTRAVENOUS
  Filled 2024-01-10: qty 50

## 2024-01-10 MED ORDER — DEXTROSE 50 % IV SOLN
1.0000 | Freq: Once | INTRAVENOUS | Status: AC
  Administered 2024-01-10: 50 mL via INTRAVENOUS
  Filled 2024-01-10: qty 50

## 2024-01-10 MED ORDER — DEXTROSE 50 % IV SOLN
INTRAVENOUS | Status: AC
  Administered 2024-01-10: 50 mL via INTRAVENOUS
  Filled 2024-01-10: qty 50

## 2024-01-10 MED ORDER — DEXTROSE 10 % IV SOLN: INTRAVENOUS | Status: DC

## 2024-01-10 MED ORDER — DEXTROSE 50 % IV SOLN: 1.0000 | Freq: Once | INTRAVENOUS | Status: AC

## 2024-01-10 MED ORDER — METHYLPREDNISOLONE SODIUM SUCC 125 MG IJ SOLR
60.0000 mg | Freq: Once | INTRAMUSCULAR | Status: AC
  Administered 2024-01-10: 60 mg via INTRAVENOUS
  Filled 2024-01-10: qty 2

## 2024-01-10 MED ORDER — GLUCAGON HCL RDNA (DIAGNOSTIC) 1 MG IJ SOLR
1.0000 mg | Freq: Once | INTRAMUSCULAR | Status: AC
  Administered 2024-01-10: 1 mg via INTRAVENOUS
  Filled 2024-01-10: qty 1

## 2024-01-10 MED ORDER — PROCHLORPERAZINE EDISYLATE 10 MG/2ML IJ SOLN
5.0000 mg | Freq: Once | INTRAMUSCULAR | Status: AC | PRN
  Administered 2024-01-10: 5 mg via INTRAVENOUS
  Filled 2024-01-10: qty 2

## 2024-01-10 NOTE — Progress Notes (Signed)
 " PROGRESS NOTE    Madison Edwards  FMW:[id number] DOB: 1945-07-01 DOA: 01/09/2024 PCP: Leonarda Roxan BROCKS, NP    Brief Narrative:  78 y.o. female with medical history significant for T2DM, HTN, HLD, morbid obesity, CKD stage IV, GERD, IDA and and depression who presented to the ED for evaluation of hypoglycemia and generalized weakness. Per spouse, patient has had generalized weakness and malaise since Sunday. They had difficulty getting her out from the floor yesterday and today he found her laying in bed breathing heavy and seemed out of it so he called EMS. Patient reports that she has had sore throat, cold chills and cough with mucus production over the last 2 days. She had some soup on Sunday but has not had any food over the last 2 days due to poor appetite. She felt significantly weak today and was unable to get out of bed. She denies any fevers, abdominal pain, nausea, vomiting, shortness of breath, dizziness or chest pain.  She denies any falls or LOC.  She takes her meds daily and has not taken any of her medications today.   ED Course: Initial vitals show patient afebrile but hypertensive with SBP in the 210s. Initial labs significant for positive influenza A by PCR, BUNs/creatinine 35/2.63, CBG 44->46->52, Hgb 9.5. EKG shows sinus rhythm. CXR shows no active disease. Pt received Tylenol , Tessalon , Tamiflu , D50 x 2 and started on D10 infusion.TRH was consulted for admission.  Assessment & Plan:   Principal Problem:   Influenza A Active Problems:   Morbid obesity due to excess calories (HCC)   Hypoglycemia   Hypertensive urgency   Type 2 diabetes mellitus with hypoglycemia without coma, without long-term current use of insulin (HCC)   Mixed hyperlipidemia   # Influenza A infection - Patient presented with 2 days of worsening weakness and malaise with associated productive cough and cold chills - Found to have positive influenza A by PCR, CXR without signs of pneumonia - Continue  Tamiflu  30 mg twice daily for 5 days (reduced dose based on kidney function) - Symptomatic management with IV hydration, scheduled Mucinex  and as needed Tylenol  - Droplet precautions   # Hypoglycemia # Hx of T2DM-she has been on D10 infusion at 150 cc an hour with ongoing hypoglycemia.  Several doses of the D50 as well as glucagon has been given.  Continue D10.  Hypoglycemic precautions in place.  Continue to hold glimepiride.  Hold short acting insulin unless blood sugar goes above 250. Due to persistent hypoglycemia blood cultures done to rule out infection.   # Hypertensive urgency continue Coreg  amlodipine and Cardura.  As needed hydralazine for SBP above 180. BP improved hold ACE and HCTZ due to increasing creatinine   # CKD stage IV creatinine seems to be trending up will hold HCTZ and ACE inhibitor.  Continue IV fluids.   # Right paratracheal soft tissue prominence  - CXR identified a right paratracheal soft tissue prominence, CT chest recommended for further characterization - Can consider CT chest inpatient after further improvement in kidney function versus outpatient imaging    # HLD - Continue atorvastatin   # GERD - Continue Protonix    # Depression - Continue sertraline    # Generalized weakness - In the setting of acute illness - PT/OT eval and treat   # Morbid obesity - BMI of 46.0 based on last weight of 119.7 kg - Follow-up with PCP for nutrition and weight loss counseling - Check weight during this hospitalization  Estimated body mass index is 46 kg/m as calculated from the following:   Height as of 09/20/23: 5' 3.5 (1.613 m).   Weight as of 09/20/23: 119.7 kg.  DVT prophylaxis:lovenox Code Status:full Family Communication:none Disposition Plan:  Status is: Inpatient    Consultants: none  Procedures: none Antimicrobials: tamiflu   Subjective: Patient resting in bed  Objective: Vitals:   01/10/24 0730 01/10/24 0859 01/10/24 1044 01/10/24 1045   BP: (!) 168/72 (!) 144/68    Pulse: 65 70    Resp: 15     Temp:   (!) 100.8 F (38.2 C) (!) 100.8 F (38.2 C)  TempSrc:   Oral Oral  SpO2: 99%       Intake/Output Summary (Last 24 hours) at 01/10/2024 1353 Last data filed at 01/09/2024 1842 Gross per 24 hour  Intake 61.76 ml  Output --  Net 61.76 ml   There were no vitals filed for this visit.  Examination:  General exam: Appears calm and comfortable  Respiratory system: Clear to auscultation. Respiratory effort normal. Cardiovascular system:reg Gastrointestinal system: Abdomen is nondistended, soft and nontender. No organomegaly or masses felt. Normal bowel sounds heard. Central nervous system: Alert and oriented.  Extremities: no edema  Data Reviewed: I have personally reviewed following labs and imaging studies  CBC: Recent Labs  Lab 01/09/24 1648 01/10/24 0302  WBC 7.8 7.3  NEUTROABS 5.7  --   HGB 9.5* 8.5*  HCT 31.8* 28.4*  MCV 79.9* 79.8*  PLT 163 147*   Basic Metabolic Panel: Recent Labs  Lab 01/09/24 1648 01/10/24 0302  NA 137 136  K 4.4 4.2  CL 105 105  CO2 22 22  GLUCOSE 90 85  BUN 35* 36*  CREATININE 2.63* 2.86*  CALCIUM 9.3 8.6*   GFR: CrCl cannot be calculated (Unknown ideal weight.). Liver Function Tests: Recent Labs  Lab 01/09/24 1648 01/10/24 0302  AST 53* 45*  ALT 21 18  ALKPHOS 140* 116  BILITOT 0.3 0.2  PROT 7.4 6.3*  ALBUMIN 4.1 3.5   No results for input(s): LIPASE, AMYLASE in the last 168 hours. No results for input(s): AMMONIA in the last 168 hours. Coagulation Profile: No results for input(s): INR, PROTIME in the last 168 hours. Cardiac Enzymes: No results for input(s): CKTOTAL, CKMB, CKMBINDEX, TROPONINI in the last 168 hours. BNP (last 3 results) No results for input(s): PROBNP in the last 8760 hours. HbA1C: Recent Labs    01/10/24 0302  HGBA1C 6.4*   CBG: Recent Labs  Lab 01/10/24 0751 01/10/24 0820 01/10/24 0915 01/10/24 1019  01/10/24 1143  GLUCAP 44* 34* 87 55* 153*   Lipid Profile: No results for input(s): CHOL, HDL, LDLCALC, TRIG, CHOLHDL, LDLDIRECT in the last 72 hours. Thyroid Function Tests: No results for input(s): TSH, T4TOTAL, FREET4, T3FREE, THYROIDAB in the last 72 hours. Anemia Panel: No results for input(s): VITAMINB12, FOLATE, FERRITIN, TIBC, IRON, RETICCTPCT in the last 72 hours. Sepsis Labs: No results for input(s): PROCALCITON, LATICACIDVEN in the last 168 hours.  Recent Results (from the past 240 hours)  Resp panel by RT-PCR (RSV, Flu A&B, Covid) Anterior Nasal Swab     Status: Abnormal   Collection Time: 01/09/24  4:51 PM   Specimen: Anterior Nasal Swab  Result Value Ref Range Status   SARS Coronavirus 2 by RT PCR NEGATIVE NEGATIVE Final    Comment: (NOTE) SARS-CoV-2 target nucleic acids are NOT DETECTED.  The SARS-CoV-2 RNA is generally detectable in upper respiratory specimens during the acute phase of infection. The  lowest concentration of SARS-CoV-2 viral copies this assay can detect is 138 copies/mL. A negative result does not preclude SARS-Cov-2 infection and should not be used as the sole basis for treatment or other patient management decisions. A negative result may occur with  improper specimen collection/handling, submission of specimen other than nasopharyngeal swab, presence of viral mutation(s) within the areas targeted by this assay, and inadequate number of viral copies(<138 copies/mL). A negative result must be combined with clinical observations, patient history, and epidemiological information. The expected result is Negative.  Fact Sheet for Patients:  bloggercourse.com  Fact Sheet for Healthcare Providers:  seriousbroker.it  This test is no t yet approved or cleared by the [place]  FDA and  has been authorized for detection and/or diagnosis of SARS-CoV-2 by FDA  under an Emergency Use Authorization (EUA). This EUA will remain  in effect (meaning this test can be used) for the duration of the COVID-19 declaration under Section 564(b)(1) of the Act, 21 U.S.C.section 360bbb-3(b)(1), unless the authorization is terminated  or revoked sooner.       Influenza A by PCR POSITIVE (A) NEGATIVE Final   Influenza B by PCR NEGATIVE NEGATIVE Final    Comment: (NOTE) The Xpert Xpress SARS-CoV-2/FLU/RSV plus assay is intended as an aid in the diagnosis of influenza from Nasopharyngeal swab specimens and should not be used as a sole basis for treatment. Nasal washings and aspirates are unacceptable for Xpert Xpress SARS-CoV-2/FLU/RSV testing.  Fact Sheet for Patients: bloggercourse.com  Fact Sheet for Healthcare Providers: seriousbroker.it  This test is not yet approved or cleared by the [place]  FDA and has been authorized for detection and/or diagnosis of SARS-CoV-2 by FDA under an Emergency Use Authorization (EUA). This EUA will remain in effect (meaning this test can be used) for the duration of the COVID-19 declaration under Section 564(b)(1) of the Act, 21 U.S.C. section 360bbb-3(b)(1), unless the authorization is terminated or revoked.     Resp Syncytial Virus by PCR NEGATIVE NEGATIVE Final    Comment: (NOTE) Fact Sheet for Patients: bloggercourse.com  Fact Sheet for Healthcare Providers: seriousbroker.it  This test is not yet approved or cleared by the [place]  FDA and has been authorized for detection and/or diagnosis of SARS-CoV-2 by FDA under an Emergency Use Authorization (EUA). This EUA will remain in effect (meaning this test can be used) for the duration of the COVID-19 declaration under Section 564(b)(1) of the Act, 21 U.S.C. section 360bbb-3(b)(1), unless the authorization is terminated or revoked.  Performed at [facility], [address], [place], [place] [address]          Radiology Studies: DG Chest Port 1 View Result Date: 01/09/2024 EXAM: 1 VIEW(S) XRAY OF THE CHEST 01/09/2024 05:20:00 PM COMPARISON: 12/03/1999 CLINICAL HISTORY: weakness FINDINGS: LUNGS AND PLEURA: No focal pulmonary opacity. No pleural effusion. No pneumothorax. HEART AND MEDIASTINUM: Tortuous thoracic aorta. Aortic arch calcifications. There is some soft tissue prominence in the upper right paratracheal region. Cardiac silhouette is within normal limits. BONES AND SOFT TISSUES: No acute osseous abnormality. IMPRESSION: 1. No acute cardiopulmonary abnormality. 2. Tortuous thoracic aorta with aortic arch atherosclerotic calcification. 3. Right paratracheal soft tissue prominence, which can be further evaluated with chest CT if not previously characterized. Electronically signed by: Greig Pique MD 01/09/2024 06:24 PM EST RP Workstation: [id number]    Scheduled Meds:  amLODipine  5 mg Oral Daily   atorvastatin  40 mg Oral QHS   carvedilol   25 mg Oral BID WC  dextromethorphan -guaiFENesin   1 tablet Oral BID   diclofenac Sodium  2 g Topical QID   doxazosin  4 mg Oral BID   ferrous sulfate  325 mg Oral QODAY   heparin   5,000 Units Subcutaneous Q8H   losartan  100 mg Oral Daily   And   hydrochlorothiazide  25 mg Oral Daily   latanoprost  1 drop Both Eyes QHS   multivitamin  15 mL Oral Daily   oseltamivir   30 mg Oral BID   pantoprazole   40 mg Oral QHS   sertraline   50 mg Oral QHS   timolol  1 drop Both Eyes q AM   Continuous Infusions:  dextrose  150 mL/hr at 01/10/24 0616     LOS: 0 days    Almarie KANDICE Hoots, MD  01/10/2024, 1:53 PM   "

## 2024-01-10 NOTE — Progress Notes (Signed)
 OT Cancellation Note  Patient Details Name: Madison Edwards MRN: [id number] DOB: June 14, 1945   Cancelled Treatment:    Reason Eval/Treat Not Completed: Medical issues which prohibited therapy Pt continues to have low glucose levels with increased lethargy. 44 @0751  and 34 @0820 . Will continue to follow patient and evaluate when appropriate.   Leita Howell, OTR/L,CBIS  Supplemental OT - [facility] and [facility] Secure Chat Preferred   01/10/2024, 11:52 AM

## 2024-01-10 NOTE — ED Notes (Addendum)
 Pt given apple juice. MD notified of CBG 44. Pt remains awake and alert.

## 2024-01-10 NOTE — Progress Notes (Signed)
 PT Cancellation Note  Patient Details Name: Madison Edwards MRN: [id number] DOB: [birth year]   Cancelled Treatment:    Reason Eval/Treat Not Completed: Fatigue/lethargy limiting ability to participate;Medical issues which prohibited therapy. Pt continues to have low glucose levels with increased lethargy. 44 @0751  and 34 @0820 . PT will continue to follow and attempt evaluation when medically appropriate.  Isaiah DEL. Braxley Balandran, PT, DPT  [employer] 01/10/2024, 9:01 AM

## 2024-01-10 NOTE — ED Notes (Signed)
 CBG 31, MD [place] notified.

## 2024-01-10 NOTE — ED Notes (Signed)
 Despite drinking 8oz apple juice, pt more lethargic. Repeat CBG 34.

## 2024-01-10 NOTE — ED Notes (Signed)
 Pt began vomiting. Medicated per MAR.

## 2024-01-11 ENCOUNTER — Inpatient Hospital Stay (HOSPITAL_COMMUNITY)

## 2024-01-11 LAB — GLUCOSE, CAPILLARY
Glucose-Capillary: 232 mg/dL — ABNORMAL HIGH (ref 70–99)
Glucose-Capillary: 258 mg/dL — ABNORMAL HIGH (ref 70–99)
Glucose-Capillary: 272 mg/dL — ABNORMAL HIGH (ref 70–99)
Glucose-Capillary: 261 mg/dL — ABNORMAL HIGH (ref 70–99)
Glucose-Capillary: 187 mg/dL — ABNORMAL HIGH (ref 70–99)

## 2024-01-11 LAB — CBC
HCT: 28.4 % — ABNORMAL LOW (ref 36.0–46.0)
Hemoglobin: 8.8 g/dL — ABNORMAL LOW (ref 12.0–15.0)
MCH: 24 pg — ABNORMAL LOW (ref 26.0–34.0)
MCHC: 31 g/dL (ref 30.0–36.0)
MCV: 77.4 fL — ABNORMAL LOW (ref 80.0–100.0)
Platelets: 141 K/uL — ABNORMAL LOW (ref 150–400)
RBC: 3.67 MIL/uL — ABNORMAL LOW (ref 3.87–5.11)
RDW: 16.3 % — ABNORMAL HIGH (ref 11.5–15.5)
WBC: 8.7 K/uL (ref 4.0–10.5)
nRBC: 0 % (ref 0.0–0.2)

## 2024-01-11 LAB — COMPREHENSIVE METABOLIC PANEL WITH GFR
ALT: 18 U/L (ref 0–44)
AST: 39 U/L (ref 15–41)
Albumin: 3.3 g/dL — ABNORMAL LOW (ref 3.5–5.0)
Alkaline Phosphatase: 113 U/L (ref 38–126)
Anion gap: 12 (ref 5–15)
BUN: 41 mg/dL — ABNORMAL HIGH (ref 8–23)
CO2: 19 mmol/L — ABNORMAL LOW (ref 22–32)
Calcium: 8.2 mg/dL — ABNORMAL LOW (ref 8.9–10.3)
Chloride: 94 mmol/L — ABNORMAL LOW (ref 98–111)
Creatinine, Ser: 3.47 mg/dL — ABNORMAL HIGH (ref 0.44–1.00)
GFR, Estimated: 13 mL/min — ABNORMAL LOW
Glucose, Bld: 247 mg/dL — ABNORMAL HIGH (ref 70–99)
Potassium: 4.5 mmol/L (ref 3.5–5.1)
Sodium: 125 mmol/L — ABNORMAL LOW (ref 135–145)
Total Bilirubin: 0.3 mg/dL (ref 0.0–1.2)
Total Protein: 6.3 g/dL — ABNORMAL LOW (ref 6.5–8.1)

## 2024-01-11 MED ORDER — VITAMIN D (ERGOCALCIFEROL) 1.25 MG (50000 UNIT) PO CAPS
50000.0000 [IU] | ORAL_CAPSULE | ORAL | Status: DC
  Administered 2024-01-15: 50000 [IU] via ORAL
  Filled 2024-01-11: qty 1

## 2024-01-11 MED ORDER — CLONIDINE HCL 0.1 MG/24HR TD PTWK
0.1000 mg | MEDICATED_PATCH | TRANSDERMAL | Status: DC
  Filled 2024-01-11: qty 1

## 2024-01-11 MED ORDER — SODIUM CHLORIDE 0.9 % IV SOLN
12.5000 mg | Freq: Four times a day (QID) | INTRAVENOUS | Status: DC | PRN
  Administered 2024-01-11 – 2024-01-12 (×2): 12.5 mg via INTRAVENOUS
  Filled 2024-01-11: qty 12.5
  Filled 2024-01-11: qty 0.5
  Filled 2024-01-11: qty 12.5
  Filled 2024-01-11: qty 0.5

## 2024-01-11 MED ORDER — OSELTAMIVIR PHOSPHATE 30 MG PO CAPS
30.0000 mg | ORAL_CAPSULE | Freq: Every day | ORAL | Status: AC
  Administered 2024-01-11 – 2024-01-13 (×3): 30 mg via ORAL
  Filled 2024-01-11 (×3): qty 1

## 2024-01-11 MED ORDER — CALCIUM CARBONATE ANTACID 500 MG PO CHEW
1.0000 | CHEWABLE_TABLET | Freq: Four times a day (QID) | ORAL | Status: DC | PRN
  Administered 2024-01-11 – 2024-01-12 (×2): 200 mg via ORAL
  Filled 2024-01-11 (×2): qty 1

## 2024-01-11 MED ORDER — HYDRALAZINE HCL 20 MG/ML IJ SOLN
10.0000 mg | INTRAMUSCULAR | Status: DC | PRN
  Administered 2024-01-11 – 2024-01-18 (×3): 10 mg via INTRAVENOUS
  Filled 2024-01-11 (×3): qty 1

## 2024-01-11 NOTE — Plan of Care (Signed)
  Problem: Education: Goal: Knowledge of General Education information will improve Description: Including pain rating scale, medication(s)/side effects and non-pharmacologic comfort measures Outcome: Progressing   Problem: Clinical Measurements: Goal: Ability to maintain clinical measurements within normal limits will improve Outcome: Progressing Goal: Diagnostic test results will improve Outcome: Progressing Goal: Respiratory complications will improve Outcome: Progressing Goal: Cardiovascular complication will be avoided Outcome: Progressing   Problem: Activity: Goal: Risk for activity intolerance will decrease Outcome: Progressing   Problem: Nutrition: Goal: Adequate nutrition will be maintained Outcome: Progressing   Problem: Coping: Goal: Level of anxiety will decrease Outcome: Progressing   Problem: Elimination: Goal: Will not experience complications related to bowel motility Outcome: Progressing Goal: Will not experience complications related to urinary retention Outcome: Progressing   Problem: Pain Managment: Goal: General experience of comfort will improve and/or be controlled Outcome: Progressing   Problem: Safety: Goal: Ability to remain free from injury will improve Outcome: Progressing   Problem: Skin Integrity: Goal: Risk for impaired skin integrity will decrease Outcome: Progressing

## 2024-01-11 NOTE — Consult Note (Signed)
 [facility] Renal Consultation Note  Requesting MD: Almarie Hoots, MD Indication for Consultation:  AKI   Chief complaint: shortness of breath, fatigue   HPI:  Madison Edwards is a 79 y.o. female with a history of CKD stage IV, type 2 DM, morbid obesity, and HTN who presented to the hospital with several days of weakness as well as cough, chills, and sore throat.  She had also been having decreased PO intake for the past couple of days.  She was found to have hypoglycemia with sugars in the 40's and 50's initially.  She was on a D10 infusion earlier.  She was diagnosed with Flu A. Got a 1 liter NS bolus on 12/30.  Creatinine has been rising recently.  Cr rose from 2.63 on admission to 2.86 and ultimately to 3.47 today.  (Note Cr 12/11 was 2.7.)   Nephrology is consulted for assistance with management of AKI.  Home meds include losartan-hydrochlorothiazide and mounjaro .  She had a bladder scan earlier today with 104 mL. Renal US  without hydro and mild inc echogenicity.    Creatinine  Date/Time Value Ref Range Status  12/21/2023 12:00 AM 2.7 (A) 0.5 - 1.1 Final   Creatinine, Ser  Date/Time Value Ref Range Status  01/11/2024 06:01 AM 3.47 (H) 0.44 - 1.00 mg/dL Final  87/68/7974 96:97 AM 2.86 (H) 0.44 - 1.00 mg/dL Final  87/69/7974 95:51 PM 2.63 (H) 0.44 - 1.00 mg/dL Final  95/90/7983 94:48 PM 1.23 (H) 0.50 - 1.10 mg/dL Final  88/75/7988 96:57 AM 1.05 0.4 - 1.2 mg/dL Final  88/76/7988 97:99 AM 1.30 (H) 0.4 - 1.2 mg/dL Final     PMHx:   Past Medical History:  Diagnosis Date   Diabetes mellitus without complication (HCC)    Hypertension     Past Surgical History:  Procedure Laterality Date   COLONOSCOPY  12/2008    Family Hx:  Family History  Problem Relation Age of Onset   Heart attack Mother    Prostate cancer Father    Stroke Sister    Hypertension Sister    Stroke Sister    Arthritis Sister    Hypertension Sister    Hypertension Brother    Cancer  Brother    Kidney disease Brother    Diabetes Brother    Cancer Brother    Liver disease Brother     Social History:  reports that she has never smoked. She does not have any smokeless tobacco history on file. She reports current alcohol use. She reports that she does not use drugs.  Allergies: Allergies[1]  Medications: Prior to Admission medications  Medication Sig Start Date End Date Taking? Authorizing Provider  amLODipine (NORVASC) 5 MG tablet Take 5 mg by mouth daily.   Yes [provider]  aspirin EC 325 MG tablet Take 325-650 mg by mouth 2 (two) times daily as needed (for pain/discomfort).   Yes [provider]  atorvastatin (LIPITOR) 40 MG tablet Take 40 mg by mouth at bedtime. 05/11/23  Yes [provider]  b complex vitamins capsule Take 1 capsule by mouth daily.   Yes [provider]  Biotin 5 MG TABS Take 5 mg by mouth 2 (two) times a week.   Yes [provider]  carvedilol  (COREG ) 25 MG tablet Take 25 mg by mouth in the morning and at bedtime. 04/07/23  Yes [provider]  doxazosin (CARDURA) 4 MG tablet Take 4 mg by mouth in the morning and at bedtime.  Yes [provider]  ferrous sulfate 325 (65 FE) MG tablet Take 325 mg by mouth See admin instructions. Take 325 mg by mouth with breakfast EVERY OTHER MORNING   Yes [provider]  fluticasone  (FLONASE ) 50 MCG/ACT nasal spray PLACE 2 SPRAYS INTO BOTH NOSTRILS DAILY AS NEEDED FOR ALLERGIES OR RHINITIS (ALLERGIES). 09/12/23  Yes Ngetich, Dinah C, NP  glimepiride (AMARYL) 4 MG tablet Take 4 mg by mouth daily with breakfast. 06/07/23  Yes [provider]  latanoprost (XALATAN) 0.005 % ophthalmic solution Place 1 drop into both eyes at bedtime. 04/16/23  Yes [provider]  losartan-hydrochlorothiazide (HYZAAR) 100-25 MG tablet Take 1 tablet by mouth daily. 05/19/23  Yes [provider]  Multiple Vitamins-Minerals (MULTIVITAMIN & MINERAL  PO) Take 1 tablet by mouth daily with breakfast.   Yes [provider]  NYQUIL COUGH DM + CONGESTION 5-6.25-10 MG/15ML LIQD Take 15-30 mLs by mouth every 6 (six) hours as needed (for flu-like symptoms).   Yes [provider]  pantoprazole  (PROTONIX ) 40 MG tablet Take 1 tablet (40 mg total) by mouth daily. Patient taking differently: Take 40 mg by mouth at bedtime. 09/20/23  Yes Ngetich, Dinah C, NP  PRILOSEC OTC 20 MG tablet Take 20 mg by mouth daily as needed (for heartburn).   Yes [provider]  sertraline  (ZOLOFT ) 50 MG tablet Take 1 tablet (50 mg total) by mouth daily. Patient taking differently: Take 50 mg by mouth at bedtime. 09/20/23  Yes Ngetich, Dinah C, NP  timolol (TIMOPTIC) 0.25 % ophthalmic solution Place 1 drop into both eyes in the morning. 06/10/23  Yes [provider]  TYLENOL  325 MG tablet Take 325-650 mg by mouth every 8 (eight) hours as needed (for knee pain- if not taking the 650 mg strength).   Yes [provider]  TYLENOL  8 HOUR ARTHRITIS PAIN 650 MG CR tablet Take 650-1,300 mg by mouth every 8 (eight) hours as needed (for knee pain- if not taking the 325 mg strength).   Yes [provider]  Vitamin D , Ergocalciferol , (DRISDOL ) 1.25 MG (50000 UNIT) CAPS capsule TAKE 1 CAPSULE (50,000 UNITS TOTAL) BY MOUTH EVERY 7 (SEVEN) DAYS Patient taking differently: Take 50,000 Units by mouth every Monday. 11/24/23  Yes Ngetich, Dinah C, NP  vitamin E 1000 UNIT capsule Take 1,000 Units by mouth daily. Patient taking differently: Take 1,000 Units by mouth See admin instructions. Take 1,000 units by mouth two to three times a week   Yes [provider]  Continuous Glucose Sensor (FREESTYLE LIBRE 3 SENSOR) MISC Place 1 sensor on the skin every 14 days. Use to check glucose continuously Patient not taking: Reported on 01/09/2024 08/16/23   Ngetich, Dinah C, NP  tirzepatide  (ZEPBOUND ) 2.5 MG/0.5ML injection vial Inject 2.5 mg into the  skin once a week. Patient not taking: Reported on 01/09/2024 09/20/23   Ngetich, Dinah C, NP    I have reviewed the patient's current and reported prior to admission medications.  Labs:     Latest Ref Rng & Units 01/11/2024    6:01 AM 01/10/2024    3:02 AM 01/09/2024    4:48 PM  BMP  Glucose 70 - 99 mg/dL 752  85  90   BUN 8 - 23 mg/dL 41  36  35   Creatinine 0.44 - 1.00 mg/dL 6.52  7.13  7.36   Sodium 135 - 145 mmol/L 125  136  137   Potassium 3.5 - 5.1 mmol/L 4.5  4.2  4.4   Chloride 98 - 111 mmol/L 94  105  105   CO2 22 - 32 mmol/L 19  22  22    Calcium 8.9 - 10.3 mg/dL 8.2  8.6  9.3     Urinalysis    Component Value Date/Time   COLORURINE STRAW (A) 01/09/2024 1646   APPEARANCEUR CLEAR 01/09/2024 1646   LABSPEC 1.011 01/09/2024 1646   PHURINE 5.0 01/09/2024 1646   GLUCOSEU NEGATIVE 01/09/2024 1646   HGBUR SMALL (A) 01/09/2024 1646   BILIRUBINUR NEGATIVE 01/09/2024 1646   KETONESUR NEGATIVE 01/09/2024 1646   PROTEINUR >=300 (A) 01/09/2024 1646   UROBILINOGEN 0.2 04/19/2014 1816   NITRITE NEGATIVE 01/09/2024 1646   LEUKOCYTESUR NEGATIVE 01/09/2024 1646     ROS:  Pertinent items noted in HPI and remainder of comprehensive ROS otherwise negative.   Physical Exam: Vitals:   01/11/24 1407 01/11/24 1608  BP: (!) 216/73 (!) 175/78  Pulse: 73 71  Resp: 16 12  Temp: 98.7 F (37.1 C) 98.5 F (36.9 C)  SpO2: 100% 100%      General: elderly female in bed in NAD  HEENT: NCAT, hoarse Eyes: EOMI sclera anicteric Neck: supple trachea midline  Heart: S1S2 no rub Lungs: clear but reduced; normal work of breathing at rest on room air  Abdomen: soft/nt/nd; obese habitus  Extremities: no edema appreciated; no cyanosis or clubbing Skin: no rash on extremities exposed Neuro: alert and oriented x 3 provides hx and follows commands   Assessment/Plan:  # AKI  - Secondary to pre-renal insults in the setting of flu A, decreased PO intake, and home ARB-hydrochlorothiazide -  Continue supportive care - hold home losartan-hydrochlorothiazide - avoid hypotension   # CKD stage IV  - Baseline Cr 2.7 recently   # Influenza A infection - on tamiflu  - see that dose was reduced due to CKD  - supportive care per primary team  # HTN  - Hold losartan-hydrochlorothiazide  - avoid hypotension    # Hyponatremia - with correction for hyperglycemia, sodium is 127-129 this AM  - See CMP is ordered for AM  - noted on zoloft  and hydrochlorothiazide.  Hydrochlorothiazide is now off.  For now await AM labs  Thank you for the consult.  Please do not hesitate to contact me with any questions regarding our patient   Katheryn JAYSON Saba 01/11/2024, 6:02 PM         [1]  Allergies Allergen Reactions   Dust Mite Extract Other (See Comments)    Sneezing and Headaches   Orange Juice [Orange Oil] Nausea Only and Other (See Comments)    Not tolerated in a concentrated form, but CAN eat oranges   Pollen Extract Other (See Comments)    Sneezing and Headaches

## 2024-01-11 NOTE — Progress Notes (Signed)
 PT Cancellation Note  Patient Details Name: Madison Edwards MRN: [id number] DOB: [birth year]   Cancelled Treatment:    Reason Eval/Treat Not Completed: Fatigue/lethargy limiting ability to participate;Patient declined, no reason specified. PT/OT assessed vitals, continued to be elevated at 192/80. Gathered subjective information and prior level of function. Attempted to proceed with mobility and objective assessment however pt continually declined stating she was too sick and nauseous to participate. Will attempt eval tomorrow AM.   Isaiah DEL. Zahir Eisenhour, PT, DPT  [employer] 01/11/2024, 1:27 PM

## 2024-01-11 NOTE — Progress Notes (Signed)
 " PROGRESS NOTE    Madison Edwards  FMW:[id number] DOB: Jun 02, 1945 DOA: 01/09/2024 PCP: Leonarda Roxan BROCKS, NP    Brief Narrative:  79 y.o. female with medical history significant for T2DM, HTN, HLD, morbid obesity, CKD stage IV, GERD, IDA and and depression who presented to the ED for evaluation of hypoglycemia and generalized weakness. Per spouse, patient has had generalized weakness and malaise since Sunday. They had difficulty getting her out from the floor yesterday and today he found her laying in bed breathing heavy and seemed out of it so he called EMS. Patient reports that she has had sore throat, cold chills and cough with mucus production over the last 2 days. She had some soup on Sunday but has not had any food over the last 2 days due to poor appetite. She felt significantly weak today and was unable to get out of bed. She denies any fevers, abdominal pain, nausea, vomiting, shortness of breath, dizziness or chest pain.  She denies any falls or LOC.  She takes her meds daily and has not taken any of her medications today.   ED Course: Initial vitals show patient afebrile but hypertensive with SBP in the 210s. Initial labs significant for positive influenza A by PCR, BUNs/creatinine 35/2.63, CBG 44->46->52, Hgb 9.5. EKG shows sinus rhythm. CXR shows no active disease. Pt received Tylenol , Tessalon , Tamiflu , D50 x 2 and started on D10 infusion.TRH was consulted for admission.  Assessment & Plan:   Principal Problem:   Influenza A Active Problems:   Morbid obesity due to excess calories (HCC)   Hypoglycemia   Hypertensive urgency   Type 2 diabetes mellitus with hypoglycemia without coma, without long-term current use of insulin (HCC)   Mixed hyperlipidemia   # Influenza A infection - Patient presented with 2 days of worsening weakness and malaise with associated productive cough and cold chills - Found to have positive influenza A by PCR, CXR without signs of pneumonia -She was  started on Tamiflu  the dose has been adjusted today to 30 mg daily with increasing creatinine.  Discussed with pharmacy - Symptomatic management with IV hydration, scheduled Mucinex  and as needed Tylenol  - Droplet precautions   # Hypoglycemia # Hx of T2DM-hypoglycemia has resolved after multiple D50 and D10 infusion since admission.  Also received a dose of Solu-Medrol.  Patient takes tirzepatide  and oral agents at home continue to hold them will continue with insulin.  Patient has been not tolerating p.o. intake has not been really eating. CBG (last 3)  Recent Labs    01/11/24 0538 01/11/24 0728 01/11/24 1128  GLUCAP 232* 258* 272*    # Hyponatremia due to isotonic fluid infusion will hold D10.    # Hypertensive urgency -continue Coreg  amlodipine and Cardura.  As needed hydralazine for SBP above 180. BP improved hold ACE and HCTZ due to increasing creatinine   # AKI on CKD stage IV creatinine 3.47 from 2.86 yesterday.  HCTZ and ACE inhibitors were on hold and she was getting IV fluids.  A bladder scan was ordered to rule out bladder distention.  Renal ultrasound is ordered.  Nephrology consulted and appreciated much.  She was on D10 150 cc/h since admission will hold this for now due to hyponatremia new.   # Right paratracheal soft tissue prominence  - CXR identified a right paratracheal soft tissue prominence, CT chest recommended for further characterization - Can consider CT chest inpatient after further improvement in kidney function versus outpatient imaging    #  HLD - Continue atorvastatin   # GERD - Continue Protonix    # Depression - Continue sertraline    # Generalized weakness - In the setting of acute illness - PT/OT eval and treat pending    # Morbid obesity - BMI of 46.0 based on last weight of 119.7 kg - Follow-up with PCP for nutrition and weight loss counseling - Check weight during this hospitalization    Estimated body mass index is 46 kg/m as calculated  from the following:   Height as of 09/20/23: 5' 3.5 (1.613 m).   Weight as of 09/20/23: 119.7 kg.  DVT prophylaxis:lovenox Code Status:full Family Communication:none Disposition Plan:  Status is: Inpatient    Consultants: none  Procedures: none Antimicrobials: tamiflu   Subjective:  Complaining of nausea and vomiting vomited some bile passing gas okay  Objective: Vitals:   01/11/24 0540 01/11/24 0544 01/11/24 1015 01/11/24 1017  BP: (!) 194/93 (!) 194/93 (!) 213/80 (!) 213/80  Pulse: 75  72 72  Resp: 20  20 15   Temp: 98.7 F (37.1 C)  98.2 F (36.8 C)   TempSrc: Oral  Oral   SpO2: 100%  100%     Intake/Output Summary (Last 24 hours) at 01/11/2024 1148 Last data filed at 01/11/2024 1000 Gross per 24 hour  Intake 275 ml  Output --  Net 275 ml   There were no vitals filed for this visit.  Examination:  General exam: Appears chronically ill-appearing in mild distress due to nausea Respiratory system: Clear to auscultation. Respiratory effort normal. Cardiovascular system:reg Gastrointestinal system: Abdomen is nondistended, soft and nontender. No organomegaly or masses felt. Normal bowel sounds heard. Central nervous system: Alert and oriented.  Extremities: no edema  Data Reviewed: I have personally reviewed following labs and imaging studies  CBC: Recent Labs  Lab 01/09/24 1648 01/10/24 0302 01/11/24 0601  WBC 7.8 7.3 8.7  NEUTROABS 5.7  --   --   HGB 9.5* 8.5* 8.8*  HCT 31.8* 28.4* 28.4*  MCV 79.9* 79.8* 77.4*  PLT 163 147* 141*   Basic Metabolic Panel: Recent Labs  Lab 01/09/24 1648 01/10/24 0302 01/11/24 0601  NA 137 136 125*  K 4.4 4.2 4.5  CL 105 105 94*  CO2 22 22 19*  GLUCOSE 90 85 247*  BUN 35* 36* 41*  CREATININE 2.63* 2.86* 3.47*  CALCIUM 9.3 8.6* 8.2*   GFR: CrCl cannot be calculated (Unknown ideal weight.). Liver Function Tests: Recent Labs  Lab 01/09/24 1648 01/10/24 0302 01/11/24 0601  AST 53* 45* 39  ALT 21 18 18   ALKPHOS  140* 116 113  BILITOT 0.3 0.2 0.3  PROT 7.4 6.3* 6.3*  ALBUMIN 4.1 3.5 3.3*   No results for input(s): LIPASE, AMYLASE in the last 168 hours. No results for input(s): AMMONIA in the last 168 hours. Coagulation Profile: No results for input(s): INR, PROTIME in the last 168 hours. Cardiac Enzymes: No results for input(s): CKTOTAL, CKMB, CKMBINDEX, TROPONINI in the last 168 hours. BNP (last 3 results) No results for input(s): PROBNP in the last 8760 hours. HbA1C: Recent Labs    01/10/24 0302  HGBA1C 6.4*   CBG: Recent Labs  Lab 01/10/24 1530 01/10/24 2237 01/11/24 0538 01/11/24 0728 01/11/24 1128  GLUCAP 82 184* 232* 258* 272*   Lipid Profile: No results for input(s): CHOL, HDL, LDLCALC, TRIG, CHOLHDL, LDLDIRECT in the last 72 hours. Thyroid Function Tests: No results for input(s): TSH, T4TOTAL, FREET4, T3FREE, THYROIDAB in the last 72 hours. Anemia Panel: No results for  input(s): VITAMINB12, FOLATE, FERRITIN, TIBC, IRON, RETICCTPCT in the last 72 hours. Sepsis Labs: No results for input(s): PROCALCITON, LATICACIDVEN in the last 168 hours.  Recent Results (from the past 240 hours)  Resp panel by RT-PCR (RSV, Flu A&B, Covid) Anterior Nasal Swab     Status: Abnormal   Collection Time: 01/09/24  4:51 PM   Specimen: Anterior Nasal Swab  Result Value Ref Range Status   SARS Coronavirus 2 by RT PCR NEGATIVE NEGATIVE Final    Comment: (NOTE) SARS-CoV-2 target nucleic acids are NOT DETECTED.  The SARS-CoV-2 RNA is generally detectable in upper respiratory specimens during the acute phase of infection. The lowest concentration of SARS-CoV-2 viral copies this assay can detect is 138 copies/mL. A negative result does not preclude SARS-Cov-2 infection and should not be used as the sole basis for treatment or other patient management decisions. A negative result may occur with  improper specimen collection/handling,  submission of specimen other than nasopharyngeal swab, presence of viral mutation(s) within the areas targeted by this assay, and inadequate number of viral copies(<138 copies/mL). A negative result must be combined with clinical observations, patient history, and epidemiological information. The expected result is Negative.  Fact Sheet for Patients:  bloggercourse.com  Fact Sheet for Healthcare Providers:  seriousbroker.it  This test is no t yet approved or cleared by the [place]  FDA and  has been authorized for detection and/or diagnosis of SARS-CoV-2 by FDA under an Emergency Use Authorization (EUA). This EUA will remain  in effect (meaning this test can be used) for the duration of the COVID-19 declaration under Section 564(b)(1) of the Act, 21 U.S.C.section 360bbb-3(b)(1), unless the authorization is terminated  or revoked sooner.       Influenza A by PCR POSITIVE (A) NEGATIVE Final   Influenza B by PCR NEGATIVE NEGATIVE Final    Comment: (NOTE) The Xpert Xpress SARS-CoV-2/FLU/RSV plus assay is intended as an aid in the diagnosis of influenza from Nasopharyngeal swab specimens and should not be used as a sole basis for treatment. Nasal washings and aspirates are unacceptable for Xpert Xpress SARS-CoV-2/FLU/RSV testing.  Fact Sheet for Patients: bloggercourse.com  Fact Sheet for Healthcare Providers: seriousbroker.it  This test is not yet approved or cleared by the [place]  FDA and has been authorized for detection and/or diagnosis of SARS-CoV-2 by FDA under an Emergency Use Authorization (EUA). This EUA will remain in effect (meaning this test can be used) for the duration of the COVID-19 declaration under Section 564(b)(1) of the Act, 21 U.S.C. section 360bbb-3(b)(1), unless the authorization is terminated or revoked.     Resp Syncytial Virus by PCR  NEGATIVE NEGATIVE Final    Comment: (NOTE) Fact Sheet for Patients: bloggercourse.com  Fact Sheet for Healthcare Providers: seriousbroker.it  This test is not yet approved or cleared by the [place]  FDA and has been authorized for detection and/or diagnosis of SARS-CoV-2 by FDA under an Emergency Use Authorization (EUA). This EUA will remain in effect (meaning this test can be used) for the duration of the COVID-19 declaration under Section 564(b)(1) of the Act, 21 U.S.C. section 360bbb-3(b)(1), unless the authorization is terminated or revoked.  Performed at [facility], [address], [place], [place] [address]   Culture, blood (Routine X 2) w Reflex to ID Panel     Status: None (Preliminary result)   Collection Time: 01/10/24  8:33 AM   Specimen: BLOOD  Result Value Ref Range Status   Specimen Description   Final  BLOOD SITE NOT SPECIFIED Performed at [facility], [address] [place], [place] [address]    Special Requests   Final    BOTTLES DRAWN AEROBIC AND ANAEROBIC Blood Culture adequate volume Performed at [facility], [address], [place], [place] [address]    Culture   Final    NO GROWTH < 24 HOURS Performed at [facility], [address], [place], [place] [address]    Report Status PENDING  Incomplete  Culture, blood (Routine X 2) w Reflex to ID Panel     Status: None (Preliminary result)   Collection Time: 01/10/24  7:09 PM   Specimen: BLOOD RIGHT HAND  Result Value Ref Range Status   Specimen Description   Final    BLOOD RIGHT HAND Performed at [facility], [address] [place], [place] [address]    Special Requests   Final    BOTTLES DRAWN AEROBIC AND ANAEROBIC Blood Culture adequate volume Performed at [facility], [address] [place], [place] [address]    Culture   Final    NO GROWTH < 12  HOURS Performed at [facility], [address], [place], [place] [address]    Report Status PENDING  Incomplete         Radiology Studies: DG Chest Port 1 View Result Date: 01/09/2024 EXAM: 1 VIEW(S) XRAY OF THE CHEST 01/09/2024 05:20:00 PM COMPARISON: 12/03/1999 CLINICAL HISTORY: weakness FINDINGS: LUNGS AND PLEURA: No focal pulmonary opacity. No pleural effusion. No pneumothorax. HEART AND MEDIASTINUM: Tortuous thoracic aorta. Aortic arch calcifications. There is some soft tissue prominence in the upper right paratracheal region. Cardiac silhouette is within normal limits. BONES AND SOFT TISSUES: No acute osseous abnormality. IMPRESSION: 1. No acute cardiopulmonary abnormality. 2. Tortuous thoracic aorta with aortic arch atherosclerotic calcification. 3. Right paratracheal soft tissue prominence, which can be further evaluated with chest CT if not previously characterized. Electronically signed by: Greig Pique MD 01/09/2024 06:24 PM EST RP Workstation: [id number]    Scheduled Meds:  amLODipine  5 mg Oral Daily   atorvastatin  40 mg Oral QHS   carvedilol   25 mg Oral BID WC   dextromethorphan -guaiFENesin   1 tablet Oral BID   diclofenac Sodium  2 g Topical QID   doxazosin  4 mg Oral BID   ferrous sulfate  325 mg Oral QODAY   heparin   5,000 Units Subcutaneous Q8H   latanoprost  1 drop Both Eyes QHS   multivitamin  15 mL Oral Daily   oseltamivir   30 mg Oral Daily   pantoprazole   40 mg Oral QHS   sertraline   50 mg Oral QHS   timolol  1 drop Both Eyes q AM   Continuous Infusions:  promethazine (PHENERGAN) injection (IM or IVPB)       LOS: 1 day    Madison KANDICE Hoots, MD  01/11/2024, 11:48 AM   "

## 2024-01-11 NOTE — Progress Notes (Signed)
" °   01/11/24 1015  Assess: MEWS Score  Temp 98.2 F (36.8 C)  BP (!) 213/80  MAP (mmHg) 116  Pulse Rate 72  Resp 20  Level of Consciousness Alert  SpO2 100 %  O2 Device Room Air  Assess: MEWS Score  MEWS Temp 0  MEWS Systolic 2  MEWS Pulse 0  MEWS RR 0  MEWS LOC 0  MEWS Score 2  MEWS Score Color Yellow  Assess: SIRS CRITERIA  SIRS Temperature  0  SIRS Respirations  0  SIRS Pulse 0  SIRS WBC 0  SIRS Score Sum  0    "

## 2024-01-11 NOTE — Progress Notes (Signed)
 OT Cancellation Note  Patient Details Name: Madison Edwards MRN: [id number] DOB: 14-Jun-1945   Cancelled Treatment:    Reason Eval/Treat Not Completed: Other (comment). The pt declined to work with therapy this date, due to malaise, nausea and fatigue. She asked for therapy to check back tomorrow.   Delanna JINNY Lesches, OTR/L 01/11/2024, 2:48 PM

## 2024-01-12 LAB — COMPREHENSIVE METABOLIC PANEL WITH GFR
ALT: 19 U/L (ref 0–44)
AST: 33 U/L (ref 15–41)
Albumin: 3.4 g/dL — ABNORMAL LOW (ref 3.5–5.0)
Alkaline Phosphatase: 113 U/L (ref 38–126)
Anion gap: 13 (ref 5–15)
BUN: 47 mg/dL — ABNORMAL HIGH (ref 8–23)
CO2: 19 mmol/L — ABNORMAL LOW (ref 22–32)
Calcium: 8.4 mg/dL — ABNORMAL LOW (ref 8.9–10.3)
Chloride: 93 mmol/L — ABNORMAL LOW (ref 98–111)
Creatinine, Ser: 3.51 mg/dL — ABNORMAL HIGH (ref 0.44–1.00)
GFR, Estimated: 13 mL/min — ABNORMAL LOW
Glucose, Bld: 175 mg/dL — ABNORMAL HIGH (ref 70–99)
Potassium: 4.9 mmol/L (ref 3.5–5.1)
Sodium: 124 mmol/L — ABNORMAL LOW (ref 135–145)
Total Bilirubin: 0.3 mg/dL (ref 0.0–1.2)
Total Protein: 6.4 g/dL — ABNORMAL LOW (ref 6.5–8.1)

## 2024-01-12 LAB — CBC
HCT: 29.1 % — ABNORMAL LOW (ref 36.0–46.0)
Hemoglobin: 9 g/dL — ABNORMAL LOW (ref 12.0–15.0)
MCH: 23.6 pg — ABNORMAL LOW (ref 26.0–34.0)
MCHC: 30.9 g/dL (ref 30.0–36.0)
MCV: 76.4 fL — ABNORMAL LOW (ref 80.0–100.0)
Platelets: 159 K/uL (ref 150–400)
RBC: 3.81 MIL/uL — ABNORMAL LOW (ref 3.87–5.11)
RDW: 16 % — ABNORMAL HIGH (ref 11.5–15.5)
WBC: 6.4 K/uL (ref 4.0–10.5)
nRBC: 0.3 % — ABNORMAL HIGH (ref 0.0–0.2)

## 2024-01-12 LAB — PHOSPHORUS: Phosphorus: 4.1 mg/dL (ref 2.5–4.6)

## 2024-01-12 MED ORDER — SODIUM CHLORIDE 0.9 % IV SOLN: INTRAVENOUS | Status: AC

## 2024-01-12 NOTE — Evaluation (Signed)
 Occupational Therapy Evaluation Patient Details Name: Madison Edwards MRN: [id number] DOB: [birth year] Today's Date: 01/12/2024   History of Present Illness   Madison Edwards is a 79 yr old female admitted to the hospital on 01-09-24 with weakness, malaise, and hypoglycemia. She was found to have influenza A, AKI on CKD IV, and hypertensive urgency. PMH: DM II, HTN, HLD, obesity, CKD IV, GERD, IDA     Clinical Impressions The pt is currently presenting well below her baseline level of functioning for self care management. She is limited by the below listed deficits (see OT problem list). As such, she is requiring increased increased assist for managing ADLs. During the session today, she required max assist x2 for supine to sit & total assist to donn her socks seated EOB. The pt was unable to clear her buttocks off the bed after initial sit to stand attempt from EOB. She then required max assist x2 to achieve standing, also needing increased cues for BLE placement on the floor, trunk shifting forward, pushing up from the bed with 1 upper extremity, and trunk extension in standing. She subsequently required SBA to CGA to perform upper body grooming seated EOB. Overall, she was also noted to be with increased deconditioning, generalized weakness, and compromised endurance; she reported dizziness with activity. She will benefit from OT services to maximize her independence with ADLs & to decrease her risk for further weakness and deconditioning. Patient will benefit from continued inpatient follow up therapy, <3 hours/day.        If plan is discharge home, recommend the following:   Two people to help with walking and/or transfers;A lot of help with bathing/dressing/bathroom;Assist for transportation;Help with stairs or ramp for entrance;Assistance with cooking/housework     Functional Status Assessment   Patient has had a recent decline in their functional status and demonstrates the ability to make  significant improvements in function in a reasonable and predictable amount of time.     Equipment Recommendations   BSC/3in1     Recommendations for Other Services         Precautions/Restrictions   Precautions Precautions: Fall     Mobility Bed Mobility Overal bed mobility: Needs Assistance Bed Mobility: Supine to Sit, Sit to Supine     Supine to sit: Max assist, +2 for physical assistance Sit to supine: Max assist, +2 for physical assistance, HOB elevated        Transfers Overall transfer level: Needs assistance Equipment used: Rolling walker (2 wheels) Transfers: Sit to/from Stand Sit to Stand: Max assist, +2 physical assistance           General transfer comment: The pt was unable to clear her buttocks off the bed after initial sit to stand attempt from EOB. She then required max assist x2 to achieve, also needing increased cues for BLE placement on the floor, trunk shifting forward, pushing up from the bed with 1 upper extremity, and trunk extension in standing      Balance       Sitting balance - Comments: CGA     Standing balance-Leahy Scale: Poor         ADL either performed or assessed with clinical judgement   ADL Overall ADL's : Needs assistance/impaired Eating/Feeding: Set up;Bed level   Grooming: Contact guard assist;Sitting Grooming Details (indicate cue type and reason): She required CGA for sitting balance EOB, in order to perform teeth brushing and face washing. Upper Body Bathing: Minimal assistance;Sitting   Lower Body Bathing: Maximal assistance;Sitting/lateral  leans   Upper Body Dressing : Minimal assistance;Sitting   Lower Body Dressing: Total assistance;Sitting/lateral leans Lower Body Dressing Details (indicate cue type and reason): She required total assist to donn her socks seated EOB. Toilet Transfer: Maximal assistance;BSC/3in1;Rolling walker (2 wheels);Stand-pivot;Cueing for sequencing Toilet Transfer Details  (indicate cue type and reason): based on clinical judgement Toileting- Clothing Manipulation and Hygiene: Maximal assistance;Sit to/from stand;Cueing for compensatory techniques;Cueing for sequencing;+2 for physical assistance Toileting - Clothing Manipulation Details (indicate cue type and reason): at bedside commode level based on clinical judgement              Pertinent Vitals/Pain Pain Assessment Pain Assessment: Faces Pain Score: 3  Pain Location: abdomen Pain Intervention(s): Limited activity within patient's tolerance, Monitored during session, Repositioned     Extremity/Trunk Assessment Upper Extremity Assessment Upper Extremity Assessment: Right hand dominant;LUE deficits/detail;RUE deficits/detail;Generalized weakness RUE Deficits / Details: Chronic shoulder AROM limitations. Elbow and hand AROM WFL. Gross strength 4-/5 to 4/5   Lower Extremity Assessment Lower Extremity Assessment: Generalized weakness       Communication Communication Communication: No apparent difficulties   Cognition Arousal: Alert Behavior During Therapy: WFL for tasks assessed/performed               OT - Cognition Comments: Oriented x4                 Following commands: Intact       Cueing  General Comments   Cueing Techniques: Verbal cues              Home Living Family/patient expects to be discharged to:: Private residence Living Arrangements: Spouse/significant other Available Help at Discharge: Family Type of Home: House       Home Layout: Two level           Prior Functioning/Environment      ADLs Comments: She is typically modified independent to independent with ADLs.    OT Problem List: Decreased strength;Decreased range of motion;Decreased activity tolerance;Impaired balance (sitting and/or standing);Decreased knowledge of use of DME or AE;Pain;Obesity   OT Treatment/Interventions: Self-care/ADL training;Therapeutic exercise;Energy  conservation;DME and/or AE instruction;Therapeutic activities;Balance training;Patient/family education      OT Goals(Current goals can be found in the care plan section)   Acute Rehab OT Goals Patient Stated Goal: to get stronger OT Goal Formulation: With patient/family Time For Goal Achievement: 01/26/24 Potential to Achieve Goals: Good ADL Goals Pt Will Perform Grooming: with set-up;sitting Pt Will Perform Upper Body Bathing: with set-up;sitting Pt Will Perform Upper Body Dressing: with set-up;sitting Pt Will Transfer to Toilet: with contact guard assist;bedside commode;stand pivot transfer Additional ADL Goal #1: The pt will perform supine to sit and sit to stand with CGA, in prep for progressive ADL participation.   OT Frequency:  Min 2X/week    Co-evaluation PT/OT/SLP Co-Evaluation/Treatment: Yes Reason for Co-Treatment: To address functional/ADL transfers;For patient/therapist safety PT goals addressed during session: Mobility/safety with mobility;Balance OT goals addressed during session: ADL's and self-care;Proper use of Adaptive equipment and DME      AM-PAC OT 6 Clicks Daily Activity     Outcome Measure Help from another person eating meals?: None Help from another person taking care of personal grooming?: A Little Help from another person toileting, which includes using toliet, bedpan, or urinal?: A Lot Help from another person bathing (including washing, rinsing, drying)?: A Lot Help from another person to put on and taking off regular upper body clothing?: A Little Help from another person to put on and  taking off regular lower body clothing?: Total 6 Click Score: 15   End of Session Equipment Utilized During Treatment: Gait belt;Rolling walker (2 wheels) Nurse Communication: Mobility status  Activity Tolerance: Patient limited by fatigue Patient left: in bed;with call bell/phone within reach;with bed alarm set;with family/visitor present  OT Visit  Diagnosis: Unsteadiness on feet (R26.81);Other abnormalities of gait and mobility (R26.89);Muscle weakness (generalized) (M62.81);History of falling (Z91.81);Pain Pain - part of body:  (abdomen)                Time: 9059-8988 OT Time Calculation (min): 31 min Charges:  OT General Charges $OT Visit: 1 Visit OT Evaluation $OT Eval Moderate Complexity: 1 Mod   Jaleal Schliep J Harris, OTR/L 01/12/2024, 12:27 PM

## 2024-01-12 NOTE — Progress Notes (Signed)
 [facility] Progress Note  Name: Madison Edwards MRN: [id number] DOB: [birth year]  Chief Complaint:    Subjective:  She had 1.2 liters UOP over 1/1.  Spoke with family at bedside.  She hasn't really been eating much.  Doesn't feel great   Review of systems:  Denies shortness of breath or chest pain  Denies nausea or vomiting    ----------------- Background on consult:  Madison Edwards is a 79 y.o. female with a history of CKD stage IV, type 2 DM, morbid obesity, and HTN who presented to the hospital with several days of weakness as well as cough, chills, and sore throat.  She had also been having decreased PO intake for the past couple of days.  She was found to have hypoglycemia with sugars in the 40's and 50's initially.  She was on a D10 infusion earlier.  She was diagnosed with Flu A. Got a 1 liter NS bolus on 12/30.  Creatinine has been rising recently.  Cr rose from 2.63 on admission to 2.86 and ultimately to 3.47 today.  (Note Cr 12/11 was 2.7.)   Nephrology is consulted for assistance with management of AKI.  Home meds include losartan-hydrochlorothiazide and mounjaro .  She had a bladder scan earlier today with 104 mL. Renal US  without hydro and mild inc echogenicity.      Intake/Output Summary (Last 24 hours) at 01/12/2024 1714 Last data filed at 01/12/2024 1050 Gross per 24 hour  Intake 300 ml  Output 800 ml  Net -500 ml    Vitals:  Vitals:   01/11/24 2056 01/12/24 0026 01/12/24 0418 01/12/24 1344  BP: (!) 159/72 (!) 157/75 (!) 156/70 (!) 174/74  Pulse:  68 62 62  Resp:   15 16  Temp:  97.9 F (36.6 C) 98.6 F (37 C) 98.4 F (36.9 C)  TempSrc:  Oral    SpO2:  98% 100% 97%     Physical Exam:  General adult female in bed in no acute distress HEENT normocephalic atraumatic extraocular movements intact sclera anicteric Neck supple trachea midline Lungs clear to auscultation bilaterally normal work of breathing at rest on room air Heart S1S2 no  rub Abdomen soft nontender obese habitus Extremities no pitting edema  Psych normal mood and affect Neuro - alert and oriented x provides hx and follows commands    Medications reviewed   Labs:     Latest Ref Rng & Units 01/12/2024    6:00 AM 01/11/2024    6:01 AM 01/10/2024    3:02 AM  BMP  Glucose 70 - 99 mg/dL 824  752  85   BUN 8 - 23 mg/dL 47  41  36   Creatinine 0.44 - 1.00 mg/dL 6.48  6.52  7.13   Sodium 135 - 145 mmol/L 124  125  136   Potassium 3.5 - 5.1 mmol/L 4.9  4.5  4.2   Chloride 98 - 111 mmol/L 93  94  105   CO2 22 - 32 mmol/L 19  19  22    Calcium 8.9 - 10.3 mg/dL 8.4  8.2  8.6      Assessment/Plan:    # AKI  - Secondary to pre-renal insults in the setting of flu A, decreased PO intake, and home ARB-hydrochlorothiazide - She has plateaued.  Continue supportive care - NS at 75 ml/hr x 12 hours - hold home losartan-hydrochlorothiazide - avoid hypotension    # CKD stage IV  - Baseline Cr 2.7 recently    #  Influenza A infection - on tamiflu  - see that dose was reduced due to CKD  - supportive care per primary team   # HTN  - Hold losartan-hydrochlorothiazide  - avoid hypotension    # Hyponatremia   - with correction for hyperglycemia, sodium was 127-129 on 1/1 am. Now dropping.  - noted on zoloft  and hydrochlorothiazide.  Hydrochlorothiazide is now off.  - May have a mixed picture but at least partially hypovolemic.  NS at 75 ml/hr x 12 hours.  Then if persistent or worse would obtain urine studies  Disposition - continue inpatient monitoring     Katheryn JAYSON Saba, MD 01/12/2024 5:32 PM

## 2024-01-12 NOTE — Progress Notes (Signed)
 " PROGRESS NOTE    Madison Edwards  FMW:[id number] DOB: 08-08-1945 DOA: 01/09/2024 PCP: Leonarda Roxan BROCKS, NP    Brief Narrative:  79 y.o. female with medical history significant for T2DM, HTN, HLD, morbid obesity, CKD stage IV, GERD, IDA and and depression who presented to the ED for evaluation of hypoglycemia and generalized weakness. Per spouse, patient has had generalized weakness and malaise since Sunday. They had difficulty getting her out from the floor yesterday and today he found her laying in bed breathing heavy and seemed out of it so he called EMS. Patient reports that she has had sore throat, cold chills and cough with mucus production over the last 2 days. She had some soup on Sunday but has not had any food over the last 2 days due to poor appetite. She felt significantly weak today and was unable to get out of bed. She denies any fevers, abdominal pain, nausea, vomiting, shortness of breath, dizziness or chest pain.  She denies any falls or LOC.  She takes her meds daily and has not taken any of her medications today.   ED Course: Initial vitals show patient afebrile but hypertensive with SBP in the 210s. Initial labs significant for positive influenza A by PCR, BUNs/creatinine 35/2.63, CBG 44->46->52, Hgb 9.5. EKG shows sinus rhythm. CXR shows no active disease. Pt received Tylenol , Tessalon , Tamiflu , D50 x 2 and started on D10 infusion.TRH was consulted for admission.  Assessment & Plan:   Principal Problem:   Influenza A Active Problems:   Morbid obesity due to excess calories (HCC)   Hypoglycemia   Hypertensive urgency   Type 2 diabetes mellitus with hypoglycemia without coma, without long-term current use of insulin (HCC)   Mixed hyperlipidemia   # Influenza A infection - Patient presented with 2 days of worsening weakness and malaise with associated productive cough and cold chills - Found to have positive influenza A by PCR, CXR without signs of pneumonia -She was  started on Tamiflu  the dose has been adjusted today to 30 mg daily with increasing creatinine.  Discussed with pharmacy - Symptomatic management scheduled Mucinex  and as needed Tylenol  - Droplet precautions   # Hypoglycemia # Hx of T2DM-hypoglycemia has resolved after multiple D50 and D10 infusion since admission.  Also received a dose of Solu-Medrol.  Patient takes tirzepatide  and oral agents at home continue to hold them will continue with insulin.  Patient has been not tolerating p.o. intake has not been really eating. CBG (last 3)  Recent Labs    01/11/24 1128 01/11/24 1604 01/11/24 2207  GLUCAP 272* 261* 187*    # Hyponatremia na 124 continue to hold isotonic fluids.  Now with hyperglycemia actual sodium is still higher.    # Hypertensive urgency -continue Coreg  amlodipine and Cardura.  As needed hydralazine for SBP above 180. Pressure improved on for 156/70 BP improved hold ACE and HCTZ due to increasing creatinine   # AKI on CKD stage IV creatinine 3.51 from 3.47 from 2.86 yesterday.   Still trending up HCTZ and ACE inhibitors were on hold and she was getting IV fluids.  A bladder scan today with 104 cc.  Patient has PureWick in place.  Renal ultrasound no hydronephrosis.  Nephrology consulted and appreciated much.  She was on D10 150 cc/h since admission will hold this for now due to hyponatremia new.   # Right paratracheal soft tissue prominence  - CXR identified a right paratracheal soft tissue prominence, CT chest recommended for further  characterization - Can consider CT chest inpatient after further improvement in kidney function versus outpatient imaging    # HLD - Continue atorvastatin   # GERD - Continue Protonix    # Depression - Continue sertraline    # Generalized weakness - In the setting of acute illness - PT/OT eval and treat pending    # Morbid obesity - BMI of 46.0 based on last weight of 119.7 kg - Follow-up with PCP for nutrition and weight loss  counseling - Check weight during this hospitalization    Estimated body mass index is 46 kg/m as calculated from the following:   Height as of 09/20/23: 5' 3.5 (1.613 m).   Weight as of 09/20/23: 119.7 kg.  DVT prophylaxis:lovenox Code Status:full Family Communication:none Disposition Plan:  Status is: Inpatient    Consultants: none  Procedures: none Antimicrobials: tamiflu   Subjective:still c/o nausea not feeling good poor appetite  Chest x-ray from yesterday shows some atelectasis with no pleural effusion  Objective: Vitals:   01/11/24 1950 01/11/24 2056 01/12/24 0026 01/12/24 0418  BP: (!) 159/72 (!) 159/72 (!) 157/75 (!) 156/70  Pulse: 68  68 62  Resp: 15   15  Temp: 98.6 F (37 C)  97.9 F (36.6 C) 98.6 F (37 C)  TempSrc:   Oral   SpO2: 100%  98% 100%    Intake/Output Summary (Last 24 hours) at 01/12/2024 1027 Last data filed at 01/11/2024 2056 Gross per 24 hour  Intake 110 ml  Output --  Net 110 ml   There were no vitals filed for this visit.  Examination:  General exam: Appears chronically ill-appearing in mild distress due to nausea Respiratory system: Clear to auscultation. Respiratory effort normal.  Decreased breath sounds Cardiovascular system:reg Gastrointestinal system: Abdomen is nondistended, soft and nontender. No organomegaly or masses felt. Normal bowel sounds heard. Central nervous system: Alert and oriented.  Extremities:  edema  Data Reviewed: I have personally reviewed following labs and imaging studies  CBC: Recent Labs  Lab 01/09/24 1648 01/10/24 0302 01/11/24 0601 01/12/24 0600  WBC 7.8 7.3 8.7 6.4  NEUTROABS 5.7  --   --   --   HGB 9.5* 8.5* 8.8* 9.0*  HCT 31.8* 28.4* 28.4* 29.1*  MCV 79.9* 79.8* 77.4* 76.4*  PLT 163 147* 141* 159   Basic Metabolic Panel: Recent Labs  Lab 01/09/24 1648 01/10/24 0302 01/11/24 0601 01/12/24 0600  NA 137 136 125* 124*  K 4.4 4.2 4.5 4.9  CL 105 105 94* 93*  CO2 22 22 19* 19*   GLUCOSE 90 85 247* 175*  BUN 35* 36* 41* 47*  CREATININE 2.63* 2.86* 3.47* 3.51*  CALCIUM 9.3 8.6* 8.2* 8.4*  PHOS  --   --   --  4.1   GFR: CrCl cannot be calculated (Unknown ideal weight.). Liver Function Tests: Recent Labs  Lab 01/09/24 1648 01/10/24 0302 01/11/24 0601 01/12/24 0600  AST 53* 45* 39 33  ALT 21 18 18 19   ALKPHOS 140* 116 113 113  BILITOT 0.3 0.2 0.3 0.3  PROT 7.4 6.3* 6.3* 6.4*  ALBUMIN 4.1 3.5 3.3* 3.4*   No results for input(s): LIPASE, AMYLASE in the last 168 hours. No results for input(s): AMMONIA in the last 168 hours. Coagulation Profile: No results for input(s): INR, PROTIME in the last 168 hours. Cardiac Enzymes: No results for input(s): CKTOTAL, CKMB, CKMBINDEX, TROPONINI in the last 168 hours. BNP (last 3 results) No results for input(s): PROBNP in the last 8760 hours. HbA1C: Recent  Labs    01/10/24 0302  HGBA1C 6.4*   CBG: Recent Labs  Lab 01/11/24 0538 01/11/24 0728 01/11/24 1128 01/11/24 1604 01/11/24 2207  GLUCAP 232* 258* 272* 261* 187*   Lipid Profile: No results for input(s): CHOL, HDL, LDLCALC, TRIG, CHOLHDL, LDLDIRECT in the last 72 hours. Thyroid Function Tests: No results for input(s): TSH, T4TOTAL, FREET4, T3FREE, THYROIDAB in the last 72 hours. Anemia Panel: No results for input(s): VITAMINB12, FOLATE, FERRITIN, TIBC, IRON, RETICCTPCT in the last 72 hours. Sepsis Labs: No results for input(s): PROCALCITON, LATICACIDVEN in the last 168 hours.  Recent Results (from the past 240 hours)  Resp panel by RT-PCR (RSV, Flu A&B, Covid) Anterior Nasal Swab     Status: Abnormal   Collection Time: 01/09/24  4:51 PM   Specimen: Anterior Nasal Swab  Result Value Ref Range Status   SARS Coronavirus 2 by RT PCR NEGATIVE NEGATIVE Final    Comment: (NOTE) SARS-CoV-2 target nucleic acids are NOT DETECTED.  The SARS-CoV-2 RNA is generally detectable in upper  respiratory specimens during the acute phase of infection. The lowest concentration of SARS-CoV-2 viral copies this assay can detect is 138 copies/mL. A negative result does not preclude SARS-Cov-2 infection and should not be used as the sole basis for treatment or other patient management decisions. A negative result may occur with  improper specimen collection/handling, submission of specimen other than nasopharyngeal swab, presence of viral mutation(s) within the areas targeted by this assay, and inadequate number of viral copies(<138 copies/mL). A negative result must be combined with clinical observations, patient history, and epidemiological information. The expected result is Negative.  Fact Sheet for Patients:  bloggercourse.com  Fact Sheet for Healthcare Providers:  seriousbroker.it  This test is no t yet approved or cleared by the [place]  FDA and  has been authorized for detection and/or diagnosis of SARS-CoV-2 by FDA under an Emergency Use Authorization (EUA). This EUA will remain  in effect (meaning this test can be used) for the duration of the COVID-19 declaration under Section 564(b)(1) of the Act, 21 U.S.C.section 360bbb-3(b)(1), unless the authorization is terminated  or revoked sooner.       Influenza A by PCR POSITIVE (A) NEGATIVE Final   Influenza B by PCR NEGATIVE NEGATIVE Final    Comment: (NOTE) The Xpert Xpress SARS-CoV-2/FLU/RSV plus assay is intended as an aid in the diagnosis of influenza from Nasopharyngeal swab specimens and should not be used as a sole basis for treatment. Nasal washings and aspirates are unacceptable for Xpert Xpress SARS-CoV-2/FLU/RSV testing.  Fact Sheet for Patients: bloggercourse.com  Fact Sheet for Healthcare Providers: seriousbroker.it  This test is not yet approved or cleared by the [place]  FDA and has been  authorized for detection and/or diagnosis of SARS-CoV-2 by FDA under an Emergency Use Authorization (EUA). This EUA will remain in effect (meaning this test can be used) for the duration of the COVID-19 declaration under Section 564(b)(1) of the Act, 21 U.S.C. section 360bbb-3(b)(1), unless the authorization is terminated or revoked.     Resp Syncytial Virus by PCR NEGATIVE NEGATIVE Final    Comment: (NOTE) Fact Sheet for Patients: bloggercourse.com  Fact Sheet for Healthcare Providers: seriousbroker.it  This test is not yet approved or cleared by the [place]  FDA and has been authorized for detection and/or diagnosis of SARS-CoV-2 by FDA under an Emergency Use Authorization (EUA). This EUA will remain in effect (meaning this test can be used) for the duration of the COVID-19 declaration under Section 564(b)(1)  of the Act, 21 U.S.C. section 360bbb-3(b)(1), unless the authorization is terminated or revoked.  Performed at [facility], [address], [place], [place] [address]   Culture, blood (Routine X 2) w Reflex to ID Panel     Status: None (Preliminary result)   Collection Time: 01/10/24  8:33 AM   Specimen: BLOOD  Result Value Ref Range Status   Specimen Description   Final    BLOOD SITE NOT SPECIFIED Performed at [facility], [address], [place], [place] [address]    Special Requests   Final    BOTTLES DRAWN AEROBIC AND ANAEROBIC Blood Culture adequate volume Performed at [facility], [address], [place], [place] [address]    Culture   Final    NO GROWTH 2 DAYS Performed at [facility], [address], [place], [place] [address]    Report Status PENDING  Incomplete  Culture, blood (Routine X 2) w Reflex to ID Panel     Status: None (Preliminary result)   Collection Time: 01/10/24  7:09 PM   Specimen: BLOOD RIGHT HAND  Result Value Ref  Range Status   Specimen Description   Final    BLOOD RIGHT HAND Performed at [facility], [address], [place], [place] [address]    Special Requests   Final    BOTTLES DRAWN AEROBIC AND ANAEROBIC Blood Culture adequate volume Performed at [facility], [address], [place], [place] [address]    Culture   Final    NO GROWTH 1 DAY Performed at [facility], [address] [place], [place] [address]    Report Status PENDING  Incomplete         Radiology Studies: US  RENAL Result Date: 01/11/2024 CLINICAL DATA:  Acute kidney injury. EXAM: RENAL / URINARY TRACT ULTRASOUND COMPLETE COMPARISON:  11/03/2022 FINDINGS: Right Kidney: Renal measurements: 11.1 x 4.4 x 4.6 cm = volume: 117 mL. Normal parenchymal echogenicity. No hydronephrosis. No visualized stone or focal lesion. Left Kidney: Renal measurements: 11.4 x 5.6 x 6.1 cm = volume: 205 mL. Question of mild increased parenchymal echogenicity. No hydronephrosis. No visualized stone or focal lesion. Bladder: Partially distended. Appears normal for degree of bladder distention. Other: Technically limited exam due to difficulty with positioning. IMPRESSION: 1. No hydronephrosis. 2. Questionable increased left renal parenchymal echogenicity suggesting chronic medical renal disease. Electronically Signed   By: Andrea Gasman M.D.   On: 01/11/2024 16:36   DG Chest 1 View Result Date: 01/11/2024 CLINICAL DATA:  Pleural effusion. EXAM: CHEST  1 VIEW COMPARISON:  01/09/2024 FINDINGS: Stable cardiomegaly. Unchanged mediastinal contours. Aortic atherosclerosis. Stable right paratracheal soft tissue density. Subsegmental opacity at the left lung base. No pleural effusion. No pulmonary edema. No pneumothorax. No acute osseous findings. IMPRESSION: 1. Subsegmental opacity at the left lung base, favor atelectasis. 2. Stable cardiomegaly. 3. No pleural effusion demonstrated on portable AP view. Electronically Signed   By:  Andrea Gasman M.D.   On: 01/11/2024 16:34    Scheduled Meds:  amLODipine  5 mg Oral Daily   atorvastatin  40 mg Oral QHS   carvedilol   25 mg Oral BID WC   dextromethorphan -guaiFENesin   1 tablet Oral BID   diclofenac Sodium  2 g Topical QID   doxazosin  4 mg Oral BID   ferrous sulfate  325 mg Oral QODAY   heparin   5,000 Units Subcutaneous Q8H   latanoprost  1 drop Both  Eyes QHS   multivitamin  15 mL Oral Daily   oseltamivir   30 mg Oral Daily   pantoprazole   40 mg Oral QHS   sertraline   50 mg Oral QHS   timolol  1 drop Both Eyes q AM   [START ON 01/15/2024] Vitamin D  (Ergocalciferol )  50,000 Units Oral Q7 days   Continuous Infusions:  promethazine (PHENERGAN) injection (IM or IVPB) 12.5 mg (01/12/24 0426)     LOS: 2 days    Almarie KANDICE Hoots, MD  01/12/2024, 10:27 AM   "

## 2024-01-12 NOTE — Evaluation (Signed)
 Physical Therapy Evaluation Patient Details Name: Madison Edwards MRN: [id number] DOB: [birth year] Today's Date: 01/12/2024  History of Present Illness  Mrs. Madison Edwards is a 6 yr olf female admitted to the hospital on 01-09-24 with weakness, malaise, and hypoglycemia. She was found to have influenza A, AKI on CKD IV, and hypertensive urgency. PMH: DM II, HTN, HLD, obesity, CKD IV, GERD, IDA  Clinical Impression  PTA and onset of illness, pt was IND with mobility using SPC to ambulate and occasional use of WC for longer distances. She currently requires MAX +2 for bed mobility and sit<>stand transfers using a RW. She is limited by pain and ongoing nausea, poor appetite, decreased functional strength and endurance, limited motivation, and home accessibility; see problem list below for additional deficits. She requires max encouragement and cues to participate and for sequencing mobility tasks. PT recommends therapy follow up at discharge with <3hrs/day at SNF to address current deficits. PT will continue to see her while she is in acute.       If plan is discharge home, recommend the following: A lot of help with walking and/or transfers;A lot of help with bathing/dressing/bathroom;Assist for transportation;Help with stairs or ramp for entrance;Assistance with cooking/housework   Can travel by private vehicle   No    Equipment Recommendations None recommended by PT;Other (comment) (defer to next level of care for DME recommendations)  Recommendations for Other Services       Functional Status Assessment Patient has had a recent decline in their functional status and demonstrates the ability to make significant improvements in function in a reasonable and predictable amount of time.     Precautions / Restrictions Precautions Precautions: Fall Recall of Precautions/Restrictions: Intact Restrictions Weight Bearing Restrictions Per Provider Order: No      Mobility  Bed Mobility Overal bed  mobility: Needs Assistance Bed Mobility: Supine to Sit, Sit to Supine     Supine to sit: Max assist, +2 for physical assistance Sit to supine: Max assist, +2 for physical assistance, HOB elevated   General bed mobility comments: pt able to assist with supine and seated scooting, however needs MAX +2 for mobility secondary to weakness    Transfers Overall transfer level: Needs assistance Equipment used: Rolling walker (2 wheels) Transfers: Sit to/from Stand Sit to Stand: Max assist, +2 physical assistance           General transfer comment: attempted however unable to clear bottom from bed surface, VC for repositioning of feet and proepr use of RW pt able to achieve standing position with MAX A +2 however unable to get hips underneath her causing increased posterior lean and TOTAL assist to maintain balance in standing    Ambulation/Gait                  Stairs            Wheelchair Mobility     Tilt Bed    Modified Rankin (Stroke Patients Only)       Balance Overall balance assessment: Needs assistance Sitting-balance support: Feet supported, Bilateral upper extremity supported Sitting balance-Leahy Scale: Fair Sitting balance - Comments: CGA   Standing balance support: Bilateral upper extremity supported, Reliant on assistive device for balance Standing balance-Leahy Scale: Poor Standing balance comment: MAX +2 for sit<>Stand                             Pertinent Vitals/Pain Pain Assessment Pain Assessment: Faces Faces  Pain Scale: Hurts little more Pain Location: abdomen Pain Intervention(s): Limited activity within patient's tolerance, Monitored during session, Repositioned    Home Living Family/patient expects to be discharged to:: Private residence Living Arrangements: Spouse/significant other Available Help at Discharge: Family Type of Home: House Home Access: Stairs to enter Entrance Stairs-Rails: Left (at back  entrance) Entrance Stairs-Number of Steps: 3-4 Alternate Level Stairs-Number of Steps: flight Home Layout: Two level;Bed/bath upstairs;Able to live on main level with bedroom/bathroom (bathroom upstairs is handicap accessible, bathroom downstairs is a tub/shower combo) Home Equipment: [employer] (2 wheels);Wheelchair - manual;Shower seat - built in;Cane - single point Additional Comments: shower seat built in to [employer]    Prior Function Prior Level of Function : Needs assist;Independent/Modified Independent;History of Falls (last six months)       Physical Assist : Mobility (physical);ADLs (physical) Mobility (physical): Gait;Stairs ADLs (physical): Dressing;IADLs Mobility Comments: normal IND with SPC, however recently has needed some assistance with mobility and stairs ADLs Comments: She is typically modified independent to independent with ADLs. Recently needed assistance for lower body dressing and footwear     Extremity/Trunk Assessment   Upper Extremity Assessment Upper Extremity Assessment: Defer to OT evaluation RUE Deficits / Details: Chronic shoulder AROM limitations. Elbow and hand AROM WFL. Gross strength 4-/5 to 4/5    Lower Extremity Assessment Lower Extremity Assessment: Generalized weakness;RLE deficits/detail;LLE deficits/detail RLE Deficits / Details: grossly 3/5 RLE Sensation: WNL LLE Deficits / Details: grossly 3-/5 LLE Sensation: WNL       Communication   Communication Communication: No apparent difficulties    Cognition Arousal: Alert Behavior During Therapy: WFL for tasks assessed/performed   PT - Cognitive impairments: No apparent impairments                         Following commands: Intact       Cueing Cueing Techniques: Verbal cues     General Comments      Exercises     Assessment/Plan    PT Assessment Patient needs continued PT services  PT Problem List Decreased strength;Pain;Decreased range of  motion;Decreased activity tolerance;Decreased balance;Decreased safety awareness;Decreased mobility;Obesity       PT Treatment Interventions DME instruction;Balance training;Patient/family education;Functional mobility training;Therapeutic activities;Therapeutic exercise;Stair training;Gait training    PT Goals (Current goals can be found in the Care Plan section)  Acute Rehab PT Goals Patient Stated Goal: be able to walk again PT Goal Formulation: With patient/family Time For Goal Achievement: 01/26/24 Potential to Achieve Goals: Good    Frequency Min 2X/week     Co-evaluation   Reason for Co-Treatment: To address functional/ADL transfers;For patient/therapist safety PT goals addressed during session: Mobility/safety with mobility;Balance OT goals addressed during session: ADL's and self-care;Proper use of Adaptive equipment and DME       AM-PAC PT 6 Clicks Mobility  Outcome Measure Help needed turning from your back to your side while in a flat bed without using bedrails?: Total Help needed moving from lying on your back to sitting on the side of a flat bed without using bedrails?: Total Help needed moving to and from a bed to a chair (including a wheelchair)?: Total Help needed standing up from a chair using your arms (e.g., wheelchair or bedside chair)?: Total Help needed to walk in hospital room?: Total Help needed climbing 3-5 steps with a railing? : Total 6 Click Score: 6    End of Session Equipment Utilized During Treatment: Gait belt Activity Tolerance: Patient limited by  fatigue;Other (comment) (limited by nausea) Patient left: in bed;with family/visitor present;with call bell/phone within reach;with bed alarm set Nurse Communication: Mobility status PT Visit Diagnosis: Unsteadiness on feet (R26.81);Muscle weakness (generalized) (M62.81);History of falling (Z91.81);Other abnormalities of gait and mobility (R26.89)    Time: 9059-8988 PT Time Calculation (min)  (ACUTE ONLY): 31 min   Charges:   PT Evaluation $PT Eval Moderate Complexity: 1 Mod PT Treatments $Therapeutic Activity: 8-22 mins           Isaiah DEL. Sanyah Molnar, PT, DPT   [employer] 01/12/2024, 1:02 PM

## 2024-01-12 NOTE — Progress Notes (Signed)
" °   01/12/24 1050  Urine Measurement/Characteristics  Urine (mL) 800 mL  Urine Color Yellow/straw  Urine Appearance Clear  Bladder Scan Volume (mL) 150 mL (leff than 150)  Hygiene Peri care    "

## 2024-01-13 LAB — BASIC METABOLIC PANEL WITH GFR
Anion gap: 11 (ref 5–15)
BUN: 57 mg/dL — ABNORMAL HIGH (ref 8–23)
CO2: 19 mmol/L — ABNORMAL LOW (ref 22–32)
Calcium: 8.2 mg/dL — ABNORMAL LOW (ref 8.9–10.3)
Chloride: 98 mmol/L (ref 98–111)
Creatinine, Ser: 3.7 mg/dL — ABNORMAL HIGH (ref 0.44–1.00)
GFR, Estimated: 12 mL/min — ABNORMAL LOW
Glucose, Bld: 186 mg/dL — ABNORMAL HIGH (ref 70–99)
Potassium: 4.7 mmol/L (ref 3.5–5.1)
Sodium: 127 mmol/L — ABNORMAL LOW (ref 135–145)

## 2024-01-13 LAB — CBC
HCT: 28.7 % — ABNORMAL LOW (ref 36.0–46.0)
Hemoglobin: 9 g/dL — ABNORMAL LOW (ref 12.0–15.0)
MCH: 23.7 pg — ABNORMAL LOW (ref 26.0–34.0)
MCHC: 31.4 g/dL (ref 30.0–36.0)
MCV: 75.7 fL — ABNORMAL LOW (ref 80.0–100.0)
Platelets: 163 K/uL (ref 150–400)
RBC: 3.79 MIL/uL — ABNORMAL LOW (ref 3.87–5.11)
RDW: 15.8 % — ABNORMAL HIGH (ref 11.5–15.5)
WBC: 6.9 K/uL (ref 4.0–10.5)
nRBC: 0 % (ref 0.0–0.2)

## 2024-01-13 MED ORDER — LACTATED RINGERS IV BOLUS
1500.0000 mL | Freq: Once | INTRAVENOUS | Status: AC
  Administered 2024-01-13: 990 mL via INTRAVENOUS

## 2024-01-13 MED ORDER — STERILE WATER FOR INJECTION IV SOLN
INTRAVENOUS | Status: DC
  Filled 2024-01-13: qty 150
  Filled 2024-01-13: qty 1000
  Filled 2024-01-13 (×3): qty 150

## 2024-01-13 NOTE — Progress Notes (Signed)
 " PROGRESS NOTE    Madison Edwards  FMW:[id number] DOB: Apr 12, 1945 DOA: 01/09/2024 PCP: Leonarda Roxan BROCKS, NP    Brief Narrative:  79 y.o. female with medical history significant for T2DM, HTN, HLD, morbid obesity, CKD stage IV, GERD, IDA and and depression who presented to the ED for evaluation of hypoglycemia and generalized weakness. Per spouse, patient has had generalized weakness and malaise since Sunday. They had difficulty getting her out from the floor yesterday and today he found her laying in bed breathing heavy and seemed out of it so he called EMS. Patient reports that she has had sore throat, cold chills and cough with mucus production over the last 2 days. She had some soup on Sunday but has not had any food over the last 2 days due to poor appetite. She felt significantly weak today and was unable to get out of bed. She denies any fevers, abdominal pain, nausea, vomiting, shortness of breath, dizziness or chest pain.  She denies any falls or LOC.  She takes her meds daily and has not taken any of her medications today.   ED Course: Initial vitals show patient afebrile but hypertensive with SBP in the 210s. Initial labs significant for positive influenza A by PCR, BUNs/creatinine 35/2.63, CBG 44->46->52, Hgb 9.5. EKG shows sinus rhythm. CXR shows no active disease. Pt received Tylenol , Tessalon , Tamiflu , D50 x 2 and started on D10 infusion.TRH was consulted for admission.  Assessment & Plan:   Principal Problem:   Influenza A Active Problems:   Morbid obesity due to excess calories (HCC)   Hypoglycemia   Hypertensive urgency   Type 2 diabetes mellitus with hypoglycemia without coma, without long-term current use of insulin  (HCC)   Mixed hyperlipidemia   # Influenza A infection - Patient presented with 2 days of worsening weakness and malaise with associated productive cough and cold chills - Found to have positive influenza A by PCR, CXR without signs of pneumonia -She was  started on Tamiflu  the dose has been adjusted today to 30 mg daily with increasing creatinine.  Discussed with pharmacy - Symptomatic management scheduled Mucinex  and as needed Tylenol  - Droplet precautions   # Hypoglycemia # Hx of T2DM-hypoglycemia has resolved after multiple D50 and D10 infusion since admission.  Also received a dose of Solu-Medrol .  Patient takes tirzepatide  and oral agents at home continue to hold them will continue with insulin .  Patient has been not tolerating p.o. intake has not been really eating. CBG (last 3)  Recent Labs    01/11/24 1128 01/11/24 1604 01/11/24 2207  GLUCAP 272* 261* 187*    # Hyponatremia na 124 continue to hold isotonic fluids.  BMP pending  # Hypertensive urgency -continue Coreg  amlodipine  and Cardura .  As needed hydralazine  for SBP above 180. Pressure improved on for 156/70 BP improved hold ACE and HCTZ due to increasing creatinine   # AKI on CKD stage IV creatinine 3.51 from 3.47 from 2.86 yesterday.   Still trending up HCTZ and ACE inhibitors were on hold and she was getting IV fluids.  A bladder scan today with 104 cc.  Patient has PureWick in place.  Renal ultrasound no hydronephrosis.  Nephrology consulted and appreciated much.  She was on D10 150 cc/h since admission will hold this for now due to hyponatremia new.   # Right paratracheal soft tissue prominence  - CXR identified a right paratracheal soft tissue prominence, CT chest recommended for further characterization - Can consider CT chest inpatient after  further improvement in kidney function versus outpatient imaging    # HLD - Continue atorvastatin    # GERD - Continue Protonix    # Depression - Continue sertraline    # Generalized weakness - In the setting of acute illness - PT/OT eval and treat pending    # Morbid obesity - BMI of 46.0 based on last weight of 119.7 kg - Follow-up with PCP for nutrition and weight loss counseling - Check weight during this  hospitalization    Estimated body mass index is 46 kg/m as calculated from the following:   Height as of 09/20/23: 5' 3.5 (1.613 m).   Weight as of 09/20/23: 119.7 kg.  DVT prophylaxis:lovenox Code Status:full Family Communication:none Disposition Plan:  Status is: Inpatient    Consultants: none  Procedures: none Antimicrobials: tamiflu   Subjective:  a little better than yesterday still nauseous   Objective: Vitals:   01/12/24 2029 01/12/24 2200 01/13/24 0429 01/13/24 0821  BP: (!) 199/68 (!) 158/66 (!) 166/72   Pulse: 63  68 71  Resp: 20  20   Temp: 98.8 F (37.1 C)  98.1 F (36.7 C)   TempSrc: Oral  Oral   SpO2: 98%  100%     Intake/Output Summary (Last 24 hours) at 01/13/2024 1115 Last data filed at 01/13/2024 0700 Gross per 24 hour  Intake 309.71 ml  Output 1100 ml  Net -790.29 ml   There were no vitals filed for this visit.  Examination:  General exam: Appears chronically ill-appearing in mild distress due to nausea Respiratory system: Clear to auscultation. Respiratory effort normal.  Decreased breath sounds Cardiovascular system:reg Gastrointestinal system: Abdomen is nondistended, soft and nontender. No organomegaly or masses felt. Normal bowel sounds heard. Central nervous system: Alert and oriented.  Extremities:  edema  Data Reviewed: I have personally reviewed following labs and imaging studies  CBC: Recent Labs  Lab 01/09/24 1648 01/10/24 0302 01/11/24 0601 01/12/24 0600  WBC 7.8 7.3 8.7 6.4  NEUTROABS 5.7  --   --   --   HGB 9.5* 8.5* 8.8* 9.0*  HCT 31.8* 28.4* 28.4* 29.1*  MCV 79.9* 79.8* 77.4* 76.4*  PLT 163 147* 141* 159   Basic Metabolic Panel: Recent Labs  Lab 01/09/24 1648 01/10/24 0302 01/11/24 0601 01/12/24 0600  NA 137 136 125* 124*  K 4.4 4.2 4.5 4.9  CL 105 105 94* 93*  CO2 22 22 19* 19*  GLUCOSE 90 85 247* 175*  BUN 35* 36* 41* 47*  CREATININE 2.63* 2.86* 3.47* 3.51*  CALCIUM  9.3 8.6* 8.2* 8.4*  PHOS  --   --   --   4.1   GFR: CrCl cannot be calculated (Unknown ideal weight.). Liver Function Tests: Recent Labs  Lab 01/09/24 1648 01/10/24 0302 01/11/24 0601 01/12/24 0600  AST 53* 45* 39 33  ALT 21 18 18 19   ALKPHOS 140* 116 113 113  BILITOT 0.3 0.2 0.3 0.3  PROT 7.4 6.3* 6.3* 6.4*  ALBUMIN 4.1 3.5 3.3* 3.4*   No results for input(s): LIPASE, AMYLASE in the last 168 hours. No results for input(s): AMMONIA in the last 168 hours. Coagulation Profile: No results for input(s): INR, PROTIME in the last 168 hours. Cardiac Enzymes: No results for input(s): CKTOTAL, CKMB, CKMBINDEX, TROPONINI in the last 168 hours. BNP (last 3 results) No results for input(s): PROBNP in the last 8760 hours. HbA1C: No results for input(s): HGBA1C in the last 72 hours.  CBG: Recent Labs  Lab 01/11/24 0538 01/11/24 0728 01/11/24 1128  01/11/24 1604 01/11/24 2207  GLUCAP 232* 258* 272* 261* 187*   Lipid Profile: No results for input(s): CHOL, HDL, LDLCALC, TRIG, CHOLHDL, LDLDIRECT in the last 72 hours. Thyroid Function Tests: No results for input(s): TSH, T4TOTAL, FREET4, T3FREE, THYROIDAB in the last 72 hours. Anemia Panel: No results for input(s): VITAMINB12, FOLATE, FERRITIN, TIBC, IRON, RETICCTPCT in the last 72 hours. Sepsis Labs: No results for input(s): PROCALCITON, LATICACIDVEN in the last 168 hours.  Recent Results (from the past 240 hours)  Resp panel by RT-PCR (RSV, Flu A&B, Covid) Anterior Nasal Swab     Status: Abnormal   Collection Time: 01/09/24  4:51 PM   Specimen: Anterior Nasal Swab  Result Value Ref Range Status   SARS Coronavirus 2 by RT PCR NEGATIVE NEGATIVE Final    Comment: (NOTE) SARS-CoV-2 target nucleic acids are NOT DETECTED.  The SARS-CoV-2 RNA is generally detectable in upper respiratory specimens during the acute phase of infection. The lowest concentration of SARS-CoV-2 viral copies this assay can detect is 138  copies/mL. A negative result does not preclude SARS-Cov-2 infection and should not be used as the sole basis for treatment or other patient management decisions. A negative result may occur with  improper specimen collection/handling, submission of specimen other than nasopharyngeal swab, presence of viral mutation(s) within the areas targeted by this assay, and inadequate number of viral copies(<138 copies/mL). A negative result must be combined with clinical observations, patient history, and epidemiological information. The expected result is Negative.  Fact Sheet for Patients:  bloggercourse.com  Fact Sheet for Healthcare Providers:  seriousbroker.it  This test is no t yet approved or cleared by the [place]  FDA and  has been authorized for detection and/or diagnosis of SARS-CoV-2 by FDA under an Emergency Use Authorization (EUA). This EUA will remain  in effect (meaning this test can be used) for the duration of the COVID-19 declaration under Section 564(b)(1) of the Act, 21 U.S.C.section 360bbb-3(b)(1), unless the authorization is terminated  or revoked sooner.       Influenza A by PCR POSITIVE (A) NEGATIVE Final   Influenza B by PCR NEGATIVE NEGATIVE Final    Comment: (NOTE) The Xpert Xpress SARS-CoV-2/FLU/RSV plus assay is intended as an aid in the diagnosis of influenza from Nasopharyngeal swab specimens and should not be used as a sole basis for treatment. Nasal washings and aspirates are unacceptable for Xpert Xpress SARS-CoV-2/FLU/RSV testing.  Fact Sheet for Patients: bloggercourse.com  Fact Sheet for Healthcare Providers: seriousbroker.it  This test is not yet approved or cleared by the [place]  FDA and has been authorized for detection and/or diagnosis of SARS-CoV-2 by FDA under an Emergency Use Authorization (EUA). This EUA will remain in effect  (meaning this test can be used) for the duration of the COVID-19 declaration under Section 564(b)(1) of the Act, 21 U.S.C. section 360bbb-3(b)(1), unless the authorization is terminated or revoked.     Resp Syncytial Virus by PCR NEGATIVE NEGATIVE Final    Comment: (NOTE) Fact Sheet for Patients: bloggercourse.com  Fact Sheet for Healthcare Providers: seriousbroker.it  This test is not yet approved or cleared by the [place]  FDA and has been authorized for detection and/or diagnosis of SARS-CoV-2 by FDA under an Emergency Use Authorization (EUA). This EUA will remain in effect (meaning this test can be used) for the duration of the COVID-19 declaration under Section 564(b)(1) of the Act, 21 U.S.C. section 360bbb-3(b)(1), unless the authorization is terminated or revoked.  Performed at [facility], [address] [place], [place] [address]   Culture, blood (Routine X 2) w Reflex to ID Panel     Status: None (Preliminary result)   Collection Time: 01/10/24  8:33 AM   Specimen: BLOOD  Result Value Ref Range Status   Specimen Description   Final    BLOOD SITE NOT SPECIFIED Performed at [facility], [address], [place], [place] [address]    Special Requests   Final    BOTTLES DRAWN AEROBIC AND ANAEROBIC Blood Culture adequate volume Performed at [facility], [address], [place], [place] [address]    Culture   Final    NO GROWTH 3 DAYS Performed at [facility], [address] [place], [place] [address]    Report Status PENDING  Incomplete  Culture, blood (Routine X 2) w Reflex to ID Panel     Status: None (Preliminary result)   Collection Time: 01/10/24  7:09 PM   Specimen: BLOOD RIGHT HAND  Result Value Ref Range Status   Specimen Description   Final    BLOOD RIGHT HAND Performed at [facility], [address] [place], [place]  [address]    Special Requests   Final    BOTTLES DRAWN AEROBIC AND ANAEROBIC Blood Culture adequate volume Performed at [facility], [address], [place], [place] [address]    Culture   Final    NO GROWTH 2 DAYS Performed at [facility], [address] [place], [place] [address]    Report Status PENDING  Incomplete         Radiology Studies: US  RENAL Result Date: 01/11/2024 CLINICAL DATA:  Acute kidney injury. EXAM: RENAL / URINARY TRACT ULTRASOUND COMPLETE COMPARISON:  11/03/2022 FINDINGS: Right Kidney: Renal measurements: 11.1 x 4.4 x 4.6 cm = volume: 117 mL. Normal parenchymal echogenicity. No hydronephrosis. No visualized stone or focal lesion. Left Kidney: Renal measurements: 11.4 x 5.6 x 6.1 cm = volume: 205 mL. Question of mild increased parenchymal echogenicity. No hydronephrosis. No visualized stone or focal lesion. Bladder: Partially distended. Appears normal for degree of bladder distention. Other: Technically limited exam due to difficulty with positioning. IMPRESSION: 1. No hydronephrosis. 2. Questionable increased left renal parenchymal echogenicity suggesting chronic medical renal disease. Electronically Signed   By: Andrea Gasman M.D.   On: 01/11/2024 16:36   DG Chest 1 View Result Date: 01/11/2024 CLINICAL DATA:  Pleural effusion. EXAM: CHEST  1 VIEW COMPARISON:  01/09/2024 FINDINGS: Stable cardiomegaly. Unchanged mediastinal contours. Aortic atherosclerosis. Stable right paratracheal soft tissue density. Subsegmental opacity at the left lung base. No pleural effusion. No pulmonary edema. No pneumothorax. No acute osseous findings. IMPRESSION: 1. Subsegmental opacity at the left lung base, favor atelectasis. 2. Stable cardiomegaly. 3. No pleural effusion demonstrated on portable AP view. Electronically Signed   By: Andrea Gasman M.D.   On: 01/11/2024 16:34    Scheduled Meds:  amLODipine   5 mg Oral Daily   atorvastatin   40 mg Oral QHS    carvedilol   25 mg Oral BID WC   dextromethorphan -guaiFENesin   1 tablet Oral BID   diclofenac  Sodium  2 g Topical QID   doxazosin   4 mg Oral BID   ferrous sulfate   325 mg Oral QODAY   heparin   5,000 Units Subcutaneous Q8H   latanoprost   1 drop Both Eyes QHS   multivitamin  15 mL Oral Daily   pantoprazole   40 mg Oral QHS   sertraline   50 mg Oral QHS   timolol   1 drop Both Eyes q AM   [START ON 01/15/2024] Vitamin D  (Ergocalciferol )  50,000 Units Oral Q7 days   Continuous Infusions:  promethazine  (PHENERGAN ) injection (IM or IVPB) 12.5 mg (01/12/24 0426)     LOS: 3 days    Almarie KANDICE Hoots, MD  01/13/2024, 11:15 AM   "

## 2024-01-13 NOTE — Progress Notes (Signed)
 [facility] Progress Note  Name: Madison Edwards MRN: [id number] DOB: 1945-05-27     Subjective:  - UOP 1900 cc yesterday - IVFs were stopped - creat up 3.7 today - Na+ 127 today - per family, pt still nauseous and not drinking or eating much   Background on consult:  Dariela KESI PERROW is a 79 y.o. female with a history of CKD stage IV, type 2 DM, morbid obesity, and HTN who presented to the hospital with several days of weakness as well as cough, chills, and sore throat.  She had also been having decreased PO intake for the past couple of days.  She was found to have hypoglycemia with sugars in the 40's and 50's initially.  She was on a D10 infusion earlier.  She was diagnosed with Flu A. Got a 1 liter NS bolus on 12/30.  Creatinine has been rising recently.  Cr rose from 2.63 on admission to 2.86 and ultimately to 3.47 today.  (Note Cr 12/11 was 2.7.)   Nephrology is consulted for assistance with management of AKI.  Home meds include losartan -hydrochlorothiazide  and mounjaro .  She had a bladder scan earlier today with 104 mL. Renal US  without hydro and mild inc echogenicity.    Vitals:  Vitals:   01/12/24 2200 01/13/24 0429 01/13/24 0821 01/13/24 1420  BP: (!) 158/66 (!) 166/72  (!) 164/62  Pulse:  68 71 68  Resp:  20    Temp:  98.1 F (36.7 C)  98.8 F (37.1 C)  TempSrc:  Oral  Oral  SpO2:  100%       Physical Exam:  General adult female no distress Lungs clear to auscultation bilat Heart S1S2 no rub Abdomen soft nontender obese habitus Extremities no pitting edema  Neuro - alert and oriented x provides hx and follows commands     Assessment/Plan:   # AKI  - Secondary to pre-renal insults in the setting of flu A, decreased PO intake, and home ARB-hydrochlorothiazide  - holding home losartan -hydrochlorothiazide  - BP's are good, but creat rising today off IVFs - on exam pt euvolemic w/ large body size -> will resume IVF's - get urine na/ creat /  osm    # CKD stage IV  - Baseline Cr 2.7, eGFR 18 ml/min, in early dec 2025   # Influenza A infection - on tamiflu  - see that dose was reduced due to CKD  - supportive care per primary team   # HTN  - holding losartan -hydrochlorothiazide   - avoid hypotension, bp's good today 150/80 range   # Hyponatremia   - sodium was 127 this am, lowest was 124 - noted on zoloft  and hydrochlorothiazide .  Hydrochlorothiazide  is now off.  - May have a mixed picture but at least partially hypovolemic  Myer Fret  MD  [facility] 01/13/2024, 7:49 PM  Recent Labs  Lab 01/11/24 0601 01/12/24 0600 01/13/24 1154  HGB 8.8* 9.0* 9.0*  ALBUMIN 3.3* 3.4*  --   CALCIUM  8.2* 8.4* 8.2*  PHOS  --  4.1  --   CREATININE 3.47* 3.51* 3.70*  K 4.5 4.9 4.7    Inpatient medications:  amLODipine   5 mg Oral Daily   atorvastatin   40 mg Oral QHS   carvedilol   25 mg Oral BID WC   dextromethorphan -guaiFENesin   1 tablet Oral BID   diclofenac  Sodium  2 g Topical QID   doxazosin   4 mg Oral BID   ferrous sulfate   325 mg Oral QODAY   heparin   5,000 Units Subcutaneous Q8H   latanoprost   1 drop Both Eyes QHS   multivitamin  15 mL Oral Daily   pantoprazole   40 mg Oral QHS   sertraline   50 mg Oral QHS   timolol   1 drop Both Eyes q AM   [START ON 01/15/2024] Vitamin D  (Ergocalciferol )  50,000 Units Oral Q7 days    promethazine  (PHENERGAN ) injection (IM or IVPB) 12.5 mg (01/12/24 0426)   acetaminophen  **OR** acetaminophen , bisacodyl , calcium  carbonate, hydrALAZINE , menthol , ondansetron  **OR** ondansetron  (ZOFRAN ) IV, promethazine  (PHENERGAN ) injection (IM or IVPB), senna-docusate

## 2024-01-14 LAB — CBC
HCT: 26.3 % — ABNORMAL LOW (ref 36.0–46.0)
Hemoglobin: 8.5 g/dL — ABNORMAL LOW (ref 12.0–15.0)
MCH: 24.5 pg — ABNORMAL LOW (ref 26.0–34.0)
MCHC: 32.3 g/dL (ref 30.0–36.0)
MCV: 75.8 fL — ABNORMAL LOW (ref 80.0–100.0)
Platelets: 155 K/uL (ref 150–400)
RBC: 3.47 MIL/uL — ABNORMAL LOW (ref 3.87–5.11)
RDW: 15.9 % — ABNORMAL HIGH (ref 11.5–15.5)
WBC: 7.4 K/uL (ref 4.0–10.5)
nRBC: 0 % (ref 0.0–0.2)

## 2024-01-14 LAB — BASIC METABOLIC PANEL WITH GFR
Anion gap: 12 (ref 5–15)
Anion gap: 10 (ref 5–15)
Anion gap: 11 (ref 5–15)
BUN: 55 mg/dL — ABNORMAL HIGH (ref 8–23)
BUN: 59 mg/dL — ABNORMAL HIGH (ref 8–23)
BUN: 60 mg/dL — ABNORMAL HIGH (ref 8–23)
CO2: 20 mmol/L — ABNORMAL LOW (ref 22–32)
CO2: 24 mmol/L (ref 22–32)
CO2: 23 mmol/L (ref 22–32)
Calcium: 8.3 mg/dL — ABNORMAL LOW (ref 8.9–10.3)
Calcium: 8.3 mg/dL — ABNORMAL LOW (ref 8.9–10.3)
Calcium: 8.1 mg/dL — ABNORMAL LOW (ref 8.9–10.3)
Chloride: 98 mmol/L (ref 98–111)
Chloride: 97 mmol/L — ABNORMAL LOW (ref 98–111)
Chloride: 98 mmol/L (ref 98–111)
Creatinine, Ser: 3.89 mg/dL — ABNORMAL HIGH (ref 0.44–1.00)
Creatinine, Ser: 3.84 mg/dL — ABNORMAL HIGH (ref 0.44–1.00)
Creatinine, Ser: 3.82 mg/dL — ABNORMAL HIGH (ref 0.44–1.00)
GFR, Estimated: 11 mL/min — ABNORMAL LOW
GFR, Estimated: 11 mL/min — ABNORMAL LOW
GFR, Estimated: 11 mL/min — ABNORMAL LOW
Glucose, Bld: 138 mg/dL — ABNORMAL HIGH (ref 70–99)
Glucose, Bld: 202 mg/dL — ABNORMAL HIGH (ref 70–99)
Glucose, Bld: 151 mg/dL — ABNORMAL HIGH (ref 70–99)
Potassium: 4.5 mmol/L (ref 3.5–5.1)
Potassium: 4.3 mmol/L (ref 3.5–5.1)
Potassium: 4.3 mmol/L (ref 3.5–5.1)
Sodium: 130 mmol/L — ABNORMAL LOW (ref 135–145)
Sodium: 132 mmol/L — ABNORMAL LOW (ref 135–145)
Sodium: 131 mmol/L — ABNORMAL LOW (ref 135–145)

## 2024-01-14 LAB — OSMOLALITY, URINE: Osmolality, Ur: 208 mosm/kg — ABNORMAL LOW (ref 300–900)

## 2024-01-14 LAB — GLUCOSE, CAPILLARY
Glucose-Capillary: 145 mg/dL — ABNORMAL HIGH (ref 70–99)
Glucose-Capillary: 162 mg/dL — ABNORMAL HIGH (ref 70–99)
Glucose-Capillary: 131 mg/dL — ABNORMAL HIGH (ref 70–99)

## 2024-01-14 LAB — SODIUM, URINE, RANDOM: Sodium, Ur: 33 mmol/L

## 2024-01-14 LAB — CREATININE, URINE, RANDOM: Creatinine, Urine: 54 mg/dL

## 2024-01-14 MED ORDER — INSULIN ASPART 100 UNIT/ML IJ SOLN
0.0000 [IU] | Freq: Three times a day (TID) | INTRAMUSCULAR | Status: DC
  Administered 2024-01-14: 2 [IU] via SUBCUTANEOUS
  Administered 2024-01-14 – 2024-01-15 (×2): 3 [IU] via SUBCUTANEOUS
  Administered 2024-01-15: 5 [IU] via SUBCUTANEOUS
  Administered 2024-01-16: 3 [IU] via SUBCUTANEOUS
  Administered 2024-01-16 – 2024-01-17 (×2): 5 [IU] via SUBCUTANEOUS
  Administered 2024-01-17: 2 [IU] via SUBCUTANEOUS
  Administered 2024-01-17: 3 [IU] via SUBCUTANEOUS
  Administered 2024-01-18: 2 [IU] via SUBCUTANEOUS
  Administered 2024-01-18: 5 [IU] via SUBCUTANEOUS
  Administered 2024-01-18 – 2024-01-19 (×3): 3 [IU] via SUBCUTANEOUS
  Filled 2024-01-14 (×3): qty 3
  Filled 2024-01-14: qty 5
  Filled 2024-01-14 (×4): qty 3
  Filled 2024-01-14: qty 5
  Filled 2024-01-14: qty 3
  Filled 2024-01-14 (×2): qty 2
  Filled 2024-01-14: qty 3

## 2024-01-14 MED ORDER — ALBUTEROL SULFATE (2.5 MG/3ML) 0.083% IN NEBU
2.5000 mg | INHALATION_SOLUTION | Freq: Four times a day (QID) | RESPIRATORY_TRACT | Status: DC
  Administered 2024-01-14 (×2): 2.5 mg via RESPIRATORY_TRACT
  Filled 2024-01-14 (×2): qty 3

## 2024-01-14 MED ORDER — ALBUTEROL SULFATE (2.5 MG/3ML) 0.083% IN NEBU
2.5000 mg | INHALATION_SOLUTION | Freq: Three times a day (TID) | RESPIRATORY_TRACT | Status: DC
  Administered 2024-01-14 – 2024-01-19 (×14): 2.5 mg via RESPIRATORY_TRACT
  Filled 2024-01-14 (×16): qty 3

## 2024-01-14 MED ORDER — ENSURE PLUS HIGH PROTEIN PO LIQD
237.0000 mL | Freq: Two times a day (BID) | ORAL | Status: DC
  Administered 2024-01-14 – 2024-01-19 (×10): 237 mL via ORAL

## 2024-01-14 NOTE — TOC Initial Note (Signed)
 Transition of Care [facility]) - Initial/Assessment Note    Patient Details  Name: Madison Edwards MRN: [id number] Date of Birth: 1945-08-24  [facility] CM/SW Contact:    Hoy DELENA Bigness, LCSW Phone Number: 01/14/2024, 1:43 PM  Clinical Narrative:                 Pt from home with spouse. Pt agreeable to SNF placement for STR and denies being at SNF in the past. [facility] checklist complete and faxed in for review. PASRR currently pending and requested records have been uploaded. Will need to refer out for SNF once hear back from [place] regarding facility options.   Expected Discharge Plan: Skilled Nursing Facility Barriers to Discharge: Continued Medical Work up, SNF Pending bed offer   Patient Goals and CMS Choice Patient states their goals for this hospitalization and ongoing recovery are:: To get rehab          Expected Discharge Plan and Services In-house Referral: Clinical Social Work Discharge Planning Services: NA Post Acute Care Choice: Skilled Nursing Facility Living arrangements for the past 2 months: Single Family Home                                      Prior Living Arrangements/Services Living arrangements for the past 2 months: Single Family Home Lives with:: Spouse Patient language and need for interpreter reviewed:: Yes Do you feel safe going back to the place where you live?: Yes      Need for Family Participation in Patient Care: No (Comment) Care giver support system in place?: No (comment) Current home services: DME (wheelchiar, RW, cane) Criminal Activity/Legal Involvement Pertinent to Current Situation/Hospitalization: No - Comment as needed  Activities of Daily Living      Permission Sought/Granted Permission sought to share information with : Facility Medical Sales Representative, Family Supports Permission granted to share information with : Yes, Verbal Permission Granted  Share Information with NAME: Raizy, Auzenne  Spouse, Emergency  Contact  [phone]  Permission granted to share info w AGENCY: [facility], SNF's        Emotional Assessment Appearance:: Appears stated age Attitude/Demeanor/Rapport: Engaged Affect (typically observed): Accepting Orientation: : Oriented to Self, Oriented to Place, Oriented to  Time, Oriented to Situation Alcohol / Substance Use: Not Applicable Psych Involvement: No (comment)  Admission diagnosis:  Hypoglycemia [E16.2] Influenza A [J10.1] Influenza [J11.1] Current moderate episode of major depressive disorder without prior episode (HCC) [F32.1] Patient Active Problem List   Diagnosis Date Noted   Hypoglycemia 01/10/2024   Hypertensive urgency 01/10/2024   Type 2 diabetes mellitus with hypoglycemia without coma, without long-term current use of insulin  (HCC) 01/10/2024   Mixed hyperlipidemia 01/10/2024   Influenza A 01/09/2024   Current moderate episode of major depressive disorder without prior episode (HCC) 09/20/2023   Type 2 diabetes mellitus with hyperglycemia, without long-term current use of insulin  (HCC) 09/20/2023   Primary hypertension 09/20/2023   Vitamin D  deficiency 09/20/2023   CKD (chronic kidney disease) stage 4, GFR 15-29 ml/min (HCC) 06/14/2023   Morbid obesity due to excess calories (HCC) 06/14/2023   PCP:  Leonarda Roxan BROCKS, NP Pharmacy:   [id number] [place], [place] - [address] [place]  [address] Phone: [phone] Fax: [phone]     Social Drivers of Health (SDOH) Social History: SDOH Screenings   Food Insecurity: Unknown (01/10/2024)  Housing: Unknown (01/10/2024)  Transportation Needs:  Unknown (01/10/2024)  Utilities: Not At Risk (01/10/2024)  Depression (PHQ2-9): Low Risk (08/16/2023)  Recent Concern: Depression (PHQ2-9) - Medium Risk (06/14/2023)  Social Connections: Unknown (01/10/2024)  Tobacco Use: Unknown (01/09/2024)   SDOH Interventions:     Readmission Risk Interventions    01/14/2024    1:41 PM   Readmission Risk Prevention Plan  Transportation Screening Complete  PCP or Specialist Appt within 5-7 Days Complete  Home Care Screening Complete  Medication Review (RN CM) Complete

## 2024-01-14 NOTE — Progress Notes (Signed)
 [facility] Progress Note  Name: Madison Edwards MRN: [id number] DOB: 1945/03/30  Subjective:  - UOP 600 cc yesterday - creat slightly up to 3.8  - Na+ 130 this am   Presentation summary: 79 y.o. female with a history of CKD stage IV, type 2 DM, morbid obesity, and HTN who presented to the hospital with several days of weakness as well as cough, chills, and sore throat.  She had also been having decreased PO intake for the past couple of days.  She was found to have hypoglycemia with sugars in the 40's and 50's initially.  She was on a D10 infusion earlier.  She was diagnosed with Flu A. Got a 1 liter NS bolus on 12/30.  Creatinine has been rising recently.  Cr rose from 2.63 on admission to 2.86 and ultimately to 3.47 today.  (Note Cr 12/11 was 2.7.)   Nephrology is consulted for assistance with management of AKI.  Home meds include losartan -hydrochlorothiazide  and mounjaro .  She had a bladder scan earlier today with 104 mL. Renal US  without hydro and mild inc echogenicity.    Vitals:  Vitals:   01/13/24 0821 01/13/24 1420 01/13/24 2012 01/14/24 0455  BP:  (!) 164/62 (!) 140/70 (!) 158/64  Pulse: 71 68 61 65  Resp:   20 20  Temp:  98.8 F (37.1 C) 98.5 F (36.9 C) 98.4 F (36.9 C)  TempSrc:  Oral Oral Oral  SpO2:   99% 98%  Weight:    108.6 kg     Physical Exam:  General adult female no distress Lungs clear to auscultation bilat, dec'd at bases Heart S1S2 no rub Abdomen soft nontender obese habitus Extremities no pitting edema  Neuro - alert and oriented x provides hx and follows commands     Assessment/Plan:   # AKI  - Secondary to pre-renal insults in the setting of flu A, decreased PO intake, and home ARB-hydrochlorothiazide ; cont to hold losartan  & hydrochlorothiazide  - BP's are stable - creat rising yesterday off IVFs - so IVFs were resumed yest w/ 1.5 L bolus and sod bicarb at 100 ml/hr  - get urine na/ creat / osm, still pending - creat up 3.8  today, will get pm labs and in am tomorrow - exam shows no vol excess, ok to cont w/ IVF's for now  # CKD stage IV  - Baseline Cr 2.7, eGFR 18 ml/min, in early dec 2025  # Hyponatremia   - sodium was 130 today, lowest was 124 - noted on zoloft  and hydrochlorothiazide .  Hydrochlorothiazide  is now off.  - May have a mixed picture but at least partially hypovolemic   # Influenza A infection - on tamiflu , dose was reduced due to CKD  - supportive care per primary team   # HTN  - holding losartan -hydrochlorothiazide   - avoid hypotension, bp's good today 150/80 range     Myer Fret  MD  [facility] 01/14/2024, 9:42 AM  Recent Labs  Lab 01/11/24 0601 01/12/24 0600 01/13/24 1154 01/14/24 0606  HGB 8.8* 9.0* 9.0* 8.5*  ALBUMIN 3.3* 3.4*  --   --   CALCIUM  8.2* 8.4* 8.2* 8.3*  PHOS  --  4.1  --   --   CREATININE 3.47* 3.51* 3.70* 3.89*  K 4.5 4.9 4.7 4.5    Inpatient medications:  amLODipine   5 mg Oral Daily   atorvastatin   40 mg Oral QHS   carvedilol   25 mg Oral BID WC   dextromethorphan -guaiFENesin   1 tablet  Oral BID   diclofenac  Sodium  2 g Topical QID   doxazosin   4 mg Oral BID   ferrous sulfate   325 mg Oral QODAY   heparin   5,000 Units Subcutaneous Q8H   latanoprost   1 drop Both Eyes QHS   multivitamin  15 mL Oral Daily   pantoprazole   40 mg Oral QHS   sertraline   50 mg Oral QHS   timolol   1 drop Both Eyes q AM   [START ON 01/15/2024] Vitamin D  (Ergocalciferol )  50,000 Units Oral Q7 days    promethazine  (PHENERGAN ) injection (IM or IVPB) 12.5 mg (01/12/24 0426)   sodium bicarbonate  150 mEq in sterile water  1,150 mL infusion 125 mL/hr at 01/14/24 0206   acetaminophen  **OR** acetaminophen , bisacodyl , calcium  carbonate, hydrALAZINE , menthol , ondansetron  **OR** ondansetron  (ZOFRAN ) IV, promethazine  (PHENERGAN ) injection (IM or IVPB), senna-docusate

## 2024-01-14 NOTE — NC FL2 (Signed)
 " [place]  MEDICAID [id number] LEVEL OF CARE FORM     IDENTIFICATION  Patient Name: ARLYCE CIRCLE Birthdate: 20-Aug-1945 Sex: female Admission Date (Current Location): 01/09/2024  [facility] and [place] Number:  Producer, Television/film/video and Address:  [facility],  [address], [place] [address]      Provider Number: [id number]  Attending Physician Name and Address:  Will Almarie MATSU, MD  Relative Name and Phone Number:  Jordynn, Marcella  Spouse, Emergency Contact  [phone]    Current Level of Care: Hospital Recommended Level of Care: Skilled Nursing Facility Prior Approval Number:    Date Approved/Denied:   PASRR Number: pending  Discharge Plan: SNF    Current Diagnoses: Patient Active Problem List   Diagnosis Date Noted   Hypoglycemia 01/10/2024   Hypertensive urgency 01/10/2024   Type 2 diabetes mellitus with hypoglycemia without coma, without long-term current use of insulin  (HCC) 01/10/2024   Mixed hyperlipidemia 01/10/2024   Influenza A 01/09/2024   Current moderate episode of major depressive disorder without prior episode (HCC) 09/20/2023   Type 2 diabetes mellitus with hyperglycemia, without long-term current use of insulin  (HCC) 09/20/2023   Primary hypertension 09/20/2023   Vitamin D  deficiency 09/20/2023   CKD (chronic kidney disease) stage 4, GFR 15-29 ml/min (HCC) 06/14/2023   Morbid obesity due to excess calories (HCC) 06/14/2023    Orientation RESPIRATION BLADDER Height & Weight     Self, Time, Situation, Place  Normal Incontinent Weight: 239 lb 6.7 oz (108.6 kg) Height:     BEHAVIORAL SYMPTOMS/MOOD NEUROLOGICAL BOWEL NUTRITION STATUS      Continent Diet (regular)  AMBULATORY STATUS COMMUNICATION OF NEEDS Skin   Extensive Assist Verbally Normal                       Personal Care Assistance Level of Assistance  Bathing, Feeding, Dressing Bathing Assistance: Maximum assistance Feeding assistance: Limited assistance Dressing  Assistance: Maximum assistance     Functional Limitations Info  Sight, Hearing, Speech Sight Info: Impaired (eyeglasses) Hearing Info: Adequate Speech Info: Adequate    SPECIAL CARE FACTORS FREQUENCY  PT (By licensed PT), OT (By licensed OT)     PT Frequency: 5x/wk OT Frequency: 5x/wk            Contractures Contractures Info: Not present    Additional Factors Info  Code Status, Allergies, Psychotropic Code Status Info: FULL Allergies Info: Dust Mite Extract, Orange Juice (Orange Oil), Pollen Extract Psychotropic Info: Zoloft          Current Medications (01/14/2024):  This is the current hospital active medication list Current Facility-Administered Medications  Medication Dose Route Frequency Provider Last Rate Last Admin   acetaminophen  (TYLENOL ) tablet 650 mg  650 mg Oral Q6H PRN Amponsah, Prosper M, MD   650 mg at 01/14/24 9387   Or   acetaminophen  (TYLENOL ) suppository 650 mg  650 mg Rectal Q6H PRN Amponsah, Prosper M, MD       albuterol  (PROVENTIL ) (2.5 MG/3ML) 0.083% nebulizer solution 2.5 mg  2.5 mg Nebulization QID Mathews, Elizabeth G, MD   2.5 mg at 01/14/24 1015   amLODipine  (NORVASC ) tablet 5 mg  5 mg Oral Daily Amponsah, Prosper M, MD   5 mg at 01/14/24 1002   atorvastatin  (LIPITOR) tablet 40 mg  40 mg Oral QHS Amponsah, Prosper M, MD   40 mg at 01/13/24 2133   bisacodyl  (DULCOLAX) EC tablet 5 mg  5 mg Oral Daily PRN Amponsah, Prosper M,  MD       calcium  carbonate (TUMS - dosed in mg elemental calcium ) chewable tablet 200 mg of elemental calcium   1 tablet Oral Q6H PRN Mathews, Elizabeth G, MD   200 mg of elemental calcium  at 01/12/24 2118   carvedilol  (COREG ) tablet 25 mg  25 mg Oral BID WC Francesca Elsie CROME, MD   25 mg at 01/14/24 1002   dextromethorphan -guaiFENesin  (MUCINEX  DM) 30-600 MG per 12 hr tablet 1 tablet  1 tablet Oral BID Lou Claretta HERO, MD   1 tablet at 01/14/24 1002   diclofenac  Sodium (VOLTAREN ) 1 % topical gel 2 g  2 g Topical QID  Amponsah, Prosper M, MD   2 g at 01/14/24 1240   doxazosin  (CARDURA ) tablet 4 mg  4 mg Oral BID Amponsah, Prosper M, MD   4 mg at 01/14/24 1002   feeding supplement (ENSURE PLUS HIGH PROTEIN) liquid 237 mL  237 mL Oral BID BM Will Almarie MATSU, MD   237 mL at 01/14/24 1015   ferrous sulfate  tablet 325 mg  325 mg Oral QODAY Amponsah, Prosper M, MD   325 mg at 01/14/24 1002   heparin  injection 5,000 Units  5,000 Units Subcutaneous Q8H Amponsah, Prosper M, MD   5,000 Units at 01/14/24 1238   hydrALAZINE  (APRESOLINE ) injection 10 mg  10 mg Intravenous Q4H PRN Mathews, Elizabeth G, MD   10 mg at 01/12/24 2110   insulin  aspart (novoLOG ) injection 0-15 Units  0-15 Units Subcutaneous TID WC Will Almarie MATSU, MD   2 Units at 01/14/24 1238   latanoprost  (XALATAN ) 0.005 % ophthalmic solution 1 drop  1 drop Both Eyes QHS Amponsah, Prosper M, MD   1 drop at 01/13/24 2150   menthol  (CEPACOL) lozenge 3 mg  1 lozenge Oral PRN Francesca Elsie CROME, MD       multivitamin liquid 15 mL  15 mL Oral Daily Lou Claretta HERO, MD   15 mL at 01/14/24 1002   ondansetron  (ZOFRAN ) tablet 4 mg  4 mg Oral Q6H PRN Amponsah, Prosper M, MD   4 mg at 01/14/24 9387   Or   ondansetron  (ZOFRAN ) injection 4 mg  4 mg Intravenous Q6H PRN Amponsah, Prosper M, MD   4 mg at 01/13/24 1424   pantoprazole  (PROTONIX ) EC tablet 40 mg  40 mg Oral QHS Amponsah, Prosper M, MD   40 mg at 01/13/24 2133   promethazine  (PHENERGAN ) 12.5 mg in sodium chloride  0.9 % 50 mL IVPB  12.5 mg Intravenous Q6H PRN Will Almarie MATSU, MD 150 mL/hr at 01/12/24 0426 12.5 mg at 01/12/24 0426   senna-docusate (Senokot-S) tablet 1 tablet  1 tablet Oral QHS PRN Lou Claretta HERO, MD       sertraline  (ZOLOFT ) tablet 50 mg  50 mg Oral QHS Amponsah, Prosper M, MD   50 mg at 01/13/24 2134   sodium bicarbonate  150 mEq in sterile water  1,150 mL infusion   Intravenous Continuous Geralynn Charleston, MD 100 mL/hr at 01/14/24 1019 New Bag at 01/14/24 1019   timolol   (TIMOPTIC ) 0.25 % ophthalmic solution 1 drop  1 drop Both Eyes q AM Lou Claretta HERO, MD   1 drop at 01/14/24 1004   [START ON 01/15/2024] Vitamin D  (Ergocalciferol ) (DRISDOL ) 1.25 MG (50000 UNIT) capsule 50,000 Units  50,000 Units Oral Q7 days Will Almarie MATSU, MD         Discharge Medications: Please see discharge summary for a list of discharge medications.  Relevant Imaging Results:  Relevant Lab Results:   Additional Information SSN: [id number]  Hoy DELENA Bigness, LCSW     "

## 2024-01-14 NOTE — Plan of Care (Signed)
   Problem: Nutrition: Goal: Adequate nutrition will be maintained Outcome: Not Progressing

## 2024-01-14 NOTE — Plan of Care (Signed)

## 2024-01-14 NOTE — Progress Notes (Signed)
 " PROGRESS NOTE    Madison Edwards  FMW:[id number] DOB: [birth year] DOA: 01/09/2024 PCP: Leonarda Roxan BROCKS, NP    Brief Narrative:  79 y.o. female with medical history significant for T2DM, HTN, HLD, morbid obesity, CKD stage IV, GERD, IDA and and depression who presented to the ED for evaluation of hypoglycemia and generalized weakness. Per spouse, patient has had generalized weakness and malaise since Sunday. They had difficulty getting her out from the floor yesterday and today he found her laying in bed breathing heavy and seemed out of it so he called EMS. Patient reports that she has had sore throat, cold chills and cough with mucus production over the last 2 days. She had some soup on Sunday but has not had any food over the last 2 days due to poor appetite. She felt significantly weak today and was unable to get out of bed. She denies any fevers, abdominal pain, nausea, vomiting, shortness of breath, dizziness or chest pain.  She denies any falls or LOC.  She takes her meds daily and has not taken any of her medications today.   ED Course: Initial vitals show patient afebrile but hypertensive with SBP in the 210s. Initial labs significant for positive influenza A by PCR, BUNs/creatinine 35/2.63, CBG 44->46->52, Hgb 9.5. EKG shows sinus rhythm. CXR shows no active disease. Pt received Tylenol , Tessalon , Tamiflu , D50 x 2 and started on D10 infusion.TRH was consulted for admission.  Assessment & Plan:   Principal Problem:   Influenza A Active Problems:   Morbid obesity due to excess calories (HCC)   Hypoglycemia   Hypertensive urgency   Type 2 diabetes mellitus with hypoglycemia without coma, without long-term current use of insulin  (HCC)   Mixed hyperlipidemia   # Influenza A infection - Patient presented with 2 days of worsening weakness and malaise with associated productive cough and cold chills - Found to have positive influenza A by PCR, CXR without signs of pneumonia -She was  started on Tamiflu  the dose has been adjusted to 30 mg daily with increasing creatinine.  Discussed with pharmacy - Symptomatic management scheduled Mucinex  and as needed Tylenol  - Droplet precautions   # Hypoglycemia now hyperglycemic # Hx of T2DM-hypoglycemia has resolved after multiple D50 and D10 infusion since admission.  Also received a dose of Solu-Medrol .  Patient takes tirzepatide  and oral agents at home continue to hold them will continue with insulin .  Patient has been not tolerating p.o. intake has not been really eating.  Started insulin  sliding scale  CBG (last 3)  Recent Labs    01/11/24 1604 01/11/24 2207 01/14/24 1142  GLUCAP 261* 187* 145*    # Hyponatremia na 124 continue to hold isotonic fluids.  Her BMP is still pending as of 2 PM today.  # Hypertensive urgency -continue Coreg  amlodipine  and Cardura .  As needed hydralazine  for SBP above 180.  hold ACE and HCTZ due to increasing creatinine   # AKI on CKD stage IV creatinine 3.51 from 3.47 from 2.86 yesterday.   Still trending up HCTZ and ACE inhibitors were on hold and she was getting IV fluids.  A bladder scan today with 104 cc.  Patient has PureWick in place.  Renal ultrasound no hydronephrosis.  Nephrology consulted and appreciated much.   Receiving IV fluids per nephrology  # Right paratracheal soft tissue prominence  - CXR identified a right paratracheal soft tissue prominence, CT chest recommended for further characterization - Can consider CT chest inpatient after further improvement in  kidney function versus outpatient imaging    # HLD - Continue atorvastatin    # GERD - Continue Protonix    # Depression - Continue sertraline    # Generalized weakness - In the setting of acute illness - PT/OT eval and treat pending    # Morbid obesity - BMI of 46.0 based on last weight of 119.7 kg - Follow-up with PCP for nutrition and weight loss counseling - Check weight during this hospitalization     Estimated body mass index is 41.75 kg/m as calculated from the following:   Height as of 09/20/23: 5' 3.5 (1.613 m).   Weight as of this encounter: 108.6 kg.  DVT prophylaxis:lovenox Code Status:full Family Communication:none Disposition Plan:  Status is: Inpatient    Consultants: none  Procedures: none Antimicrobials: tamiflu   Subjective:    Labs from today pending did not get out of bed yesterday continues to have nausea  Objective: Vitals:   01/13/24 1420 01/13/24 2012 01/14/24 0455 01/14/24 1236  BP: (!) 164/62 (!) 140/70 (!) 158/64 (!) 150/116  Pulse: 68 61 65 70  Resp:  20 20 18   Temp: 98.8 F (37.1 C) 98.5 F (36.9 C) 98.4 F (36.9 C) 98.2 F (36.8 C)  TempSrc: Oral Oral Oral Oral  SpO2:  99% 98%   Weight:   108.6 kg     Intake/Output Summary (Last 24 hours) at 01/14/2024 1348 Last data filed at 01/14/2024 0950 Gross per 24 hour  Intake 1296.02 ml  Output 1400 ml  Net -103.98 ml   Filed Weights   01/14/24 0455  Weight: 108.6 kg    Examination:  General exam: Appears chronically ill-appearing in mild distress due to nausea Respiratory system: Clear to auscultation. Respiratory effort normal.  Decreased breath sounds Cardiovascular system:reg Gastrointestinal system: Abdomen is nondistended, soft and nontender. No organomegaly or masses felt. Normal bowel sounds heard. Central nervous system: Alert and oriented.  Extremities:  edema  Data Reviewed: I have personally reviewed following labs and imaging studies  CBC: Recent Labs  Lab 01/09/24 1648 01/10/24 0302 01/11/24 0601 01/12/24 0600 01/13/24 1154 01/14/24 0606  WBC 7.8 7.3 8.7 6.4 6.9 7.4  NEUTROABS 5.7  --   --   --   --   --   HGB 9.5* 8.5* 8.8* 9.0* 9.0* 8.5*  HCT 31.8* 28.4* 28.4* 29.1* 28.7* 26.3*  MCV 79.9* 79.8* 77.4* 76.4* 75.7* 75.8*  PLT 163 147* 141* 159 163 155   Basic Metabolic Panel: Recent Labs  Lab 01/10/24 0302 01/11/24 0601 01/12/24 0600 01/13/24 1154  01/14/24 0606  NA 136 125* 124* 127* 130*  K 4.2 4.5 4.9 4.7 4.5  CL 105 94* 93* 98 98  CO2 22 19* 19* 19* 20*  GLUCOSE 85 247* 175* 186* 138*  BUN 36* 41* 47* 57* 55*  CREATININE 2.86* 3.47* 3.51* 3.70* 3.89*  CALCIUM  8.6* 8.2* 8.4* 8.2* 8.3*  PHOS  --   --  4.1  --   --    GFR: Estimated Creatinine Clearance: 14.2 mL/min (A) (by C-G formula based on SCr of 3.89 mg/dL (H)). Liver Function Tests: Recent Labs  Lab 01/09/24 1648 01/10/24 0302 01/11/24 0601 01/12/24 0600  AST 53* 45* 39 33  ALT 21 18 18 19   ALKPHOS 140* 116 113 113  BILITOT 0.3 0.2 0.3 0.3  PROT 7.4 6.3* 6.3* 6.4*  ALBUMIN 4.1 3.5 3.3* 3.4*   No results for input(s): LIPASE, AMYLASE in the last 168 hours. No results for input(s): AMMONIA in the last  168 hours. Coagulation Profile: No results for input(s): INR, PROTIME in the last 168 hours. Cardiac Enzymes: No results for input(s): CKTOTAL, CKMB, CKMBINDEX, TROPONINI in the last 168 hours. BNP (last 3 results) No results for input(s): PROBNP in the last 8760 hours. HbA1C: No results for input(s): HGBA1C in the last 72 hours.  CBG: Recent Labs  Lab 01/11/24 0728 01/11/24 1128 01/11/24 1604 01/11/24 2207 01/14/24 1142  GLUCAP 258* 272* 261* 187* 145*   Lipid Profile: No results for input(s): CHOL, HDL, LDLCALC, TRIG, CHOLHDL, LDLDIRECT in the last 72 hours. Thyroid Function Tests: No results for input(s): TSH, T4TOTAL, FREET4, T3FREE, THYROIDAB in the last 72 hours. Anemia Panel: No results for input(s): VITAMINB12, FOLATE, FERRITIN, TIBC, IRON, RETICCTPCT in the last 72 hours. Sepsis Labs: No results for input(s): PROCALCITON, LATICACIDVEN in the last 168 hours.  Recent Results (from the past 240 hours)  Resp panel by RT-PCR (RSV, Flu A&B, Covid) Anterior Nasal Swab     Status: Abnormal   Collection Time: 01/09/24  4:51 PM   Specimen: Anterior Nasal Swab  Result Value Ref Range  Status   SARS Coronavirus 2 by RT PCR NEGATIVE NEGATIVE Final    Comment: (NOTE) SARS-CoV-2 target nucleic acids are NOT DETECTED.  The SARS-CoV-2 RNA is generally detectable in upper respiratory specimens during the acute phase of infection. The lowest concentration of SARS-CoV-2 viral copies this assay can detect is 138 copies/mL. A negative result does not preclude SARS-Cov-2 infection and should not be used as the sole basis for treatment or other patient management decisions. A negative result may occur with  improper specimen collection/handling, submission of specimen other than nasopharyngeal swab, presence of viral mutation(s) within the areas targeted by this assay, and inadequate number of viral copies(<138 copies/mL). A negative result must be combined with clinical observations, patient history, and epidemiological information. The expected result is Negative.  Fact Sheet for Patients:  bloggercourse.com  Fact Sheet for Healthcare Providers:  seriousbroker.it  This test is no t yet approved or cleared by the [place]  FDA and  has been authorized for detection and/or diagnosis of SARS-CoV-2 by FDA under an Emergency Use Authorization (EUA). This EUA will remain  in effect (meaning this test can be used) for the duration of the COVID-19 declaration under Section 564(b)(1) of the Act, 21 U.S.C.section 360bbb-3(b)(1), unless the authorization is terminated  or revoked sooner.       Influenza A by PCR POSITIVE (A) NEGATIVE Final   Influenza B by PCR NEGATIVE NEGATIVE Final    Comment: (NOTE) The Xpert Xpress SARS-CoV-2/FLU/RSV plus assay is intended as an aid in the diagnosis of influenza from Nasopharyngeal swab specimens and should not be used as a sole basis for treatment. Nasal washings and aspirates are unacceptable for Xpert Xpress SARS-CoV-2/FLU/RSV testing.  Fact Sheet for  Patients: bloggercourse.com  Fact Sheet for Healthcare Providers: seriousbroker.it  This test is not yet approved or cleared by the [place]  FDA and has been authorized for detection and/or diagnosis of SARS-CoV-2 by FDA under an Emergency Use Authorization (EUA). This EUA will remain in effect (meaning this test can be used) for the duration of the COVID-19 declaration under Section 564(b)(1) of the Act, 21 U.S.C. section 360bbb-3(b)(1), unless the authorization is terminated or revoked.     Resp Syncytial Virus by PCR NEGATIVE NEGATIVE Final    Comment: (NOTE) Fact Sheet for Patients: bloggercourse.com  Fact Sheet for Healthcare Providers: seriousbroker.it  This test is not yet approved or cleared  by the [place]  FDA and has been authorized for detection and/or diagnosis of SARS-CoV-2 by FDA under an Emergency Use Authorization (EUA). This EUA will remain in effect (meaning this test can be used) for the duration of the COVID-19 declaration under Section 564(b)(1) of the Act, 21 U.S.C. section 360bbb-3(b)(1), unless the authorization is terminated or revoked.  Performed at [facility], [address] [place], [place] [address]   Culture, blood (Routine X 2) w Reflex to ID Panel     Status: None (Preliminary result)   Collection Time: 01/10/24  8:33 AM   Specimen: BLOOD  Result Value Ref Range Status   Specimen Description   Final    BLOOD SITE NOT SPECIFIED Performed at [facility], [address], [place], [place] [address]    Special Requests   Final    BOTTLES DRAWN AEROBIC AND ANAEROBIC Blood Culture adequate volume Performed at [facility], [address] [place], [place] [address]    Culture   Final    NO GROWTH 4 DAYS Performed at [facility], [address] [place], [place]  [address]    Report Status PENDING  Incomplete  Culture, blood (Routine X 2) w Reflex to ID Panel     Status: None (Preliminary result)   Collection Time: 01/10/24  7:09 PM   Specimen: BLOOD RIGHT HAND  Result Value Ref Range Status   Specimen Description   Final    BLOOD RIGHT HAND Performed at [facility], [address], [place], [place] [address]    Special Requests   Final    BOTTLES DRAWN AEROBIC AND ANAEROBIC Blood Culture adequate volume Performed at [facility], [address] [place], [place] [address]    Culture   Final    NO GROWTH 3 DAYS Performed at [facility], [address] [place], [place] [address]    Report Status PENDING  Incomplete         Radiology Studies: No results found.   Scheduled Meds:  albuterol   2.5 mg Nebulization QID   amLODipine   5 mg Oral Daily   atorvastatin   40 mg Oral QHS   carvedilol   25 mg Oral BID WC   dextromethorphan -guaiFENesin   1 tablet Oral BID   diclofenac  Sodium  2 g Topical QID   doxazosin   4 mg Oral BID   feeding supplement  237 mL Oral BID BM   ferrous sulfate   325 mg Oral QODAY   heparin   5,000 Units Subcutaneous Q8H   insulin  aspart  0-15 Units Subcutaneous TID WC   latanoprost   1 drop Both Eyes QHS   multivitamin  15 mL Oral Daily   pantoprazole   40 mg Oral QHS   sertraline   50 mg Oral QHS   timolol   1 drop Both Eyes q AM   [START ON 01/15/2024] Vitamin D  (Ergocalciferol )  50,000 Units Oral Q7 days   Continuous Infusions:  promethazine  (PHENERGAN ) injection (IM or IVPB) 12.5 mg (01/12/24 0426)   sodium bicarbonate  150 mEq in sterile water  1,150 mL infusion 100 mL/hr at 01/14/24 1019     LOS: 4 days    Madison KANDICE Hoots, MD  01/14/2024, 1:48 PM   "

## 2024-01-15 ENCOUNTER — Inpatient Hospital Stay (HOSPITAL_COMMUNITY)

## 2024-01-15 LAB — BASIC METABOLIC PANEL WITH GFR
Anion gap: 9 (ref 5–15)
BUN: 56 mg/dL — ABNORMAL HIGH (ref 8–23)
CO2: 27 mmol/L (ref 22–32)
Calcium: 8.3 mg/dL — ABNORMAL LOW (ref 8.9–10.3)
Chloride: 97 mmol/L — ABNORMAL LOW (ref 98–111)
Creatinine, Ser: 3.69 mg/dL — ABNORMAL HIGH (ref 0.44–1.00)
GFR, Estimated: 12 mL/min — ABNORMAL LOW
Glucose, Bld: 147 mg/dL — ABNORMAL HIGH (ref 70–99)
Potassium: 4.5 mmol/L (ref 3.5–5.1)
Sodium: 134 mmol/L — ABNORMAL LOW (ref 135–145)

## 2024-01-15 LAB — GLUCOSE, CAPILLARY
Glucose-Capillary: 137 mg/dL — ABNORMAL HIGH (ref 70–99)
Glucose-Capillary: 192 mg/dL — ABNORMAL HIGH (ref 70–99)
Glucose-Capillary: 207 mg/dL — ABNORMAL HIGH (ref 70–99)
Glucose-Capillary: 104 mg/dL — ABNORMAL HIGH (ref 70–99)

## 2024-01-15 LAB — CULTURE, BLOOD (ROUTINE X 2)
Culture: NO GROWTH
Special Requests: ADEQUATE

## 2024-01-15 MED ORDER — SALINE SPRAY 0.65 % NA SOLN
1.0000 | NASAL | Status: DC | PRN
  Administered 2024-01-15: 1 via NASAL
  Filled 2024-01-15: qty 44

## 2024-01-15 MED ORDER — LACTATED RINGERS IV SOLN: INTRAVENOUS | Status: AC

## 2024-01-15 NOTE — Progress Notes (Addendum)
 [facility] Progress Note  Name: Madison Edwards MRN: [id number] DOB: [birth year]  Subjective:  - UOP 600 cc yesterday - creat down to 3.6 today   Presentation summary: 79 y.o. female with a history of CKD stage IV, type 2 DM, morbid obesity, and HTN who presented to the hospital with several days of weakness as well as cough, chills, and sore throat.  She had also been having decreased PO intake for the past couple of days.  She was found to have hypoglycemia with sugars in the 40's and 50's initially.  She was on a D10 infusion earlier.  She was diagnosed with Flu A. Got a 1 liter NS bolus on 12/30.  Creatinine has been rising recently.  Cr rose from 2.63 on admission to 2.86 and ultimately to 3.47 today.  (Note Cr 12/11 was 2.7.)   Nephrology is consulted for assistance with management of AKI.  Home meds include losartan -hydrochlorothiazide  and mounjaro .  She had a bladder scan earlier today with 104 mL. Renal US  without hydro and mild inc echogenicity.    Vitals:  Vitals:   01/14/24 2054 01/15/24 0534 01/15/24 0852 01/15/24 1131  BP: (!) 161/70 (!) 173/71  (!) 169/57  Pulse: 65 75  70  Resp: 20 20  18   Temp: 98.2 F (36.8 C) 98.9 F (37.2 C)  98 F (36.7 C)  TempSrc: Oral Oral  Oral  SpO2: 97% 98% 92%   Weight:  99.8 kg       Physical Exam:  General adult female no distress Lungs clear to auscultation bilat, dec'd at bases Heart S1S2 no rub Abdomen soft nontender obese habitus Extremities no pitting edema  Neuro - alert and oriented x provides hx and follows commands     Assessment/Plan:   # AKI  - Secondary to pre-renal insults in the setting of flu A, poor po and ARB - ARB should be held 1-2 months - BP's are stable - creat went up when IVF's were stopped so IVFs were resumed - creat down today at 3.6, very good UOP - f/u CXR this am is clear - will cont IVF's and lower to 75 cc/hr  # CKD stage IV  - Baseline Cr 2.7, eGFR 18 ml/min, in early dec  2025  # Hyponatremia   - avoid hydrochlorothiazide  as a rule - okay to retry zoloft / SSRI's in my opinion - this was partially hypovolemic and responding to IVF's - resolved now  # HTN  - hold losartan  1-2 mos - on coreg , norvasc  and cardura  - bp's good 150/80 range - avoid hydrochlorothiazide , listed as intolerance   # Influenza A infection - on tamiflu , dose was reduced due to CKD  - supportive care per primary team    Rob Geralynn  MD  [facility] 01/15/2024, 12:47 PM  Recent Labs  Lab 01/11/24 0601 01/12/24 0600 01/13/24 1154 01/14/24 0606 01/14/24 1412 01/14/24 1828 01/15/24 0530  HGB 8.8* 9.0* 9.0* 8.5*  --   --   --   ALBUMIN 3.3* 3.4*  --   --   --   --   --   CALCIUM  8.2* 8.4* 8.2* 8.3*   < > 8.1* 8.3*  PHOS  --  4.1  --   --   --   --   --   CREATININE 3.47* 3.51* 3.70* 3.89*   < > 3.82* 3.69*  K 4.5 4.9 4.7 4.5   < > 4.3 4.5   < > = values in this  interval not displayed.    Inpatient medications:  albuterol   2.5 mg Nebulization TID   amLODipine   5 mg Oral Daily   atorvastatin   40 mg Oral QHS   carvedilol   25 mg Oral BID WC   dextromethorphan -guaiFENesin   1 tablet Oral BID   diclofenac  Sodium  2 g Topical QID   doxazosin   4 mg Oral BID   feeding supplement  237 mL Oral BID BM   ferrous sulfate   325 mg Oral QODAY   heparin   5,000 Units Subcutaneous Q8H   insulin  aspart  0-15 Units Subcutaneous TID WC   latanoprost   1 drop Both Eyes QHS   multivitamin  15 mL Oral Daily   pantoprazole   40 mg Oral QHS   sertraline   50 mg Oral QHS   timolol   1 drop Both Eyes q AM   Vitamin D  (Ergocalciferol )  50,000 Units Oral Q7 days    promethazine  (PHENERGAN ) injection (IM or IVPB) 12.5 mg (01/12/24 0426)   sodium bicarbonate  150 mEq in sterile water  1,150 mL infusion 100 mL/hr at 01/15/24 1143   acetaminophen  **OR** acetaminophen , bisacodyl , calcium  carbonate, hydrALAZINE , menthol , ondansetron  **OR** ondansetron  (ZOFRAN ) IV, promethazine  (PHENERGAN ) injection (IM or IVPB),  senna-docusate, sodium chloride 

## 2024-01-15 NOTE — Progress Notes (Signed)
 " PROGRESS NOTE    Madison Edwards  FMW:[id number] DOB: 02-09-1945 DOA: 01/09/2024 PCP: Leonarda Roxan BROCKS, NP    Brief Narrative:  79 y.o. female with medical history significant for T2DM, HTN, HLD, morbid obesity, CKD stage IV, GERD, IDA and and depression who presented to the ED for evaluation of hypoglycemia and generalized weakness. Per spouse, patient has had generalized weakness and malaise since Sunday. They had difficulty getting her out from the floor yesterday and today he found her laying in bed breathing heavy and seemed out of it so he called EMS. Patient reports that she has had sore throat, cold chills and cough with mucus production over the last 2 days. She had some soup on Sunday but has not had any food over the last 2 days due to poor appetite. She felt significantly weak today and was unable to get out of bed. She denies any fevers, abdominal pain, nausea, vomiting, shortness of breath, dizziness or chest pain.  She denies any falls or LOC.  She takes her meds daily and has not taken any of her medications today.   ED Course: Initial vitals show patient afebrile but hypertensive with SBP in the 210s. Initial labs significant for positive influenza A by PCR, BUNs/creatinine 35/2.63, CBG 44->46->52, Hgb 9.5. EKG shows sinus rhythm. CXR shows no active disease. Pt received Tylenol , Tessalon , Tamiflu , D50 x 2 and started on D10 infusion.TRH was consulted for admission.  Assessment & Plan:   Principal Problem:   Influenza A Active Problems:   Morbid obesity due to excess calories (HCC)   Hypoglycemia   Hypertensive urgency   Type 2 diabetes mellitus with hypoglycemia without coma, without long-term current use of insulin  (HCC)   Mixed hyperlipidemia   # Influenza A infection - Patient presented with 2 days of worsening weakness and malaise with associated productive cough and cold chills - Found to have positive influenza A by PCR, CXR without signs of pneumonia - She  finished a course of renally dosed Tamiflu . - Symptomatic management with scheduled Mucinex , nebulizer and as needed Tylenol  - Droplet precautions -Patient will need SN[facility] the [facility]   # Hypoglycemia now hyperglycemic # Hx of T2DM-hypoglycemia has resolved after multiple D50 and D10 infusion since admission. Patient takes tirzepatide  and oral agents at home continue to hold them will continue with insulin .  Patient has been not tolerating p.o. intake has not been really eating due to nausea.   Started insulin  sliding scale  CBG (last 3)  Recent Labs    01/14/24 2055 01/15/24 0739 01/15/24 1124  GLUCAP 131* 137* 192*    # Hyponatremia improved with NS 134 from 125. (She had received isotonic fluids D5 and D10's initially since she came in with hypoglycemia which caused hyponatremia.)  # Hypertensive urgency -resolved.   Continue Coreg  amlodipine  and Cardura .   Holding ACE and HCTZ.     # AKI on CKD stage IV creatinine 3.51 from 3.47 from 2.86 yesterday.   Creatinine finally trending down and plateaued. HCTZ and ACE inhibitors were on hold and she was getting IV fluids.   Patient has PureWick in place.   Renal ultrasound no hydronephrosis.  Nephrology consulted and appreciated much.   Receiving IV fluids per nephrology  # Right paratracheal soft tissue prominence  - CXR identified a right paratracheal soft tissue prominence, CT chest recommended for further characterization - Can consider CT chest inpatient after further improvement in kidney function versus outpatient  imaging    # HLD - Continue atorvastatin    # GERD - Continue Protonix    # Depression - Continue sertraline    # Generalized weakness - In the setting of acute illness - PT/OT eval and treat pending    # Morbid obesity - BMI of 46.0 based on last weight of 119.7 kg - Follow-up with PCP for nutrition and weight loss counseling - Check weight during this hospitalization     Estimated body mass index is 38.36 kg/m as calculated from the following:   Height as of 09/20/23: 5' 3.5 (1.613 m).   Weight as of this encounter: 99.8 kg.  DVT prophylaxis:lovenox Code Status:full Family Communication:none Disposition Plan:  Status is: Inpatient    Consultants: none  Procedures: none Antimicrobials: Finished course of Tamiflu   Subjective:    Husband at bedside Patient feels about the same may be a tiny bit better than yesterday still nauseous and not eating much  Upper airway wheezing chest x-ray pending Objective: Vitals:   01/14/24 2054 01/15/24 0534 01/15/24 0852 01/15/24 1131  BP: (!) 161/70 (!) 173/71  (!) 169/57  Pulse: 65 75  70  Resp: 20 20  18   Temp: 98.2 F (36.8 C) 98.9 F (37.2 C)  98 F (36.7 C)  TempSrc: Oral Oral  Oral  SpO2: 97% 98% 92%   Weight:  99.8 kg      Intake/Output Summary (Last 24 hours) at 01/15/2024 1148 Last data filed at 01/15/2024 1000 Gross per 24 hour  Intake 2752.56 ml  Output 1950 ml  Net 802.56 ml   Filed Weights   01/14/24 0455 01/15/24 0534  Weight: 108.6 kg 99.8 kg    Examination:  General exam: Appears chronically ill-appearing in mild distress due to nausea Respiratory system: Clear to auscultation. Respiratory effort normal.  Decreased breath sounds Cardiovascular system:reg Gastrointestinal system: Abdomen is nondistended, soft and nontender. No organomegaly or masses felt. Normal bowel sounds heard. Central nervous system: Alert and oriented.  Extremities: no edema  Data Reviewed: I have personally reviewed following labs and imaging studies  CBC: Recent Labs  Lab 01/09/24 1648 01/10/24 0302 01/11/24 0601 01/12/24 0600 01/13/24 1154 01/14/24 0606  WBC 7.8 7.3 8.7 6.4 6.9 7.4  NEUTROABS 5.7  --   --   --   --   --   HGB 9.5* 8.5* 8.8* 9.0* 9.0* 8.5*  HCT 31.8* 28.4* 28.4* 29.1* 28.7* 26.3*  MCV 79.9* 79.8* 77.4* 76.4* 75.7* 75.8*  PLT 163 147* 141* 159 163 155   Basic Metabolic  Panel: Recent Labs  Lab 01/12/24 0600 01/13/24 1154 01/14/24 0606 01/14/24 1412 01/14/24 1828 01/15/24 0530  NA 124* 127* 130* 132* 131* 134*  K 4.9 4.7 4.5 4.3 4.3 4.5  CL 93* 98 98 97* 98 97*  CO2 19* 19* 20* 24 23 27   GLUCOSE 175* 186* 138* 202* 151* 147*  BUN 47* 57* 55* 59* 60* 56*  CREATININE 3.51* 3.70* 3.89* 3.84* 3.82* 3.69*  CALCIUM  8.4* 8.2* 8.3* 8.3* 8.1* 8.3*  PHOS 4.1  --   --   --   --   --    GFR: Estimated Creatinine Clearance: 14.3 mL/min (A) (by C-G formula based on SCr of 3.69 mg/dL (H)). Liver Function Tests: Recent Labs  Lab 01/09/24 1648 01/10/24 0302 01/11/24 0601 01/12/24 0600  AST 53* 45* 39 33  ALT 21 18 18 19   ALKPHOS 140* 116 113 113  BILITOT 0.3 0.2 0.3 0.3  PROT 7.4 6.3* 6.3* 6.4*  ALBUMIN  4.1 3.5 3.3* 3.4*   No results for input(s): LIPASE, AMYLASE in the last 168 hours. No results for input(s): AMMONIA in the last 168 hours. Coagulation Profile: No results for input(s): INR, PROTIME in the last 168 hours. Cardiac Enzymes: No results for input(s): CKTOTAL, CKMB, CKMBINDEX, TROPONINI in the last 168 hours. BNP (last 3 results) No results for input(s): PROBNP in the last 8760 hours. HbA1C: No results for input(s): HGBA1C in the last 72 hours.  CBG: Recent Labs  Lab 01/14/24 1142 01/14/24 1612 01/14/24 2055 01/15/24 0739 01/15/24 1124  GLUCAP 145* 162* 131* 137* 192*   Lipid Profile: No results for input(s): CHOL, HDL, LDLCALC, TRIG, CHOLHDL, LDLDIRECT in the last 72 hours. Thyroid Function Tests: No results for input(s): TSH, T4TOTAL, FREET4, T3FREE, THYROIDAB in the last 72 hours. Anemia Panel: No results for input(s): VITAMINB12, FOLATE, FERRITIN, TIBC, IRON, RETICCTPCT in the last 72 hours. Sepsis Labs: No results for input(s): PROCALCITON, LATICACIDVEN in the last 168 hours.  Recent Results (from the past 240 hours)  Resp panel by RT-PCR (RSV, Flu A&B, Covid)  Anterior Nasal Swab     Status: Abnormal   Collection Time: 01/09/24  4:51 PM   Specimen: Anterior Nasal Swab  Result Value Ref Range Status   SARS Coronavirus 2 by RT PCR NEGATIVE NEGATIVE Final    Comment: (NOTE) SARS-CoV-2 target nucleic acids are NOT DETECTED.  The SARS-CoV-2 RNA is generally detectable in upper respiratory specimens during the acute phase of infection. The lowest concentration of SARS-CoV-2 viral copies this assay can detect is 138 copies/mL. A negative result does not preclude SARS-Cov-2 infection and should not be used as the sole basis for treatment or other patient management decisions. A negative result may occur with  improper specimen collection/handling, submission of specimen other than nasopharyngeal swab, presence of viral mutation(s) within the areas targeted by this assay, and inadequate number of viral copies(<138 copies/mL). A negative result must be combined with clinical observations, patient history, and epidemiological information. The expected result is Negative.  Fact Sheet for Patients:  bloggercourse.com  Fact Sheet for Healthcare Providers:  seriousbroker.it  This test is no t yet approved or cleared by the [place]  FDA and  has been authorized for detection and/or diagnosis of SARS-CoV-2 by FDA under an Emergency Use Authorization (EUA). This EUA will remain  in effect (meaning this test can be used) for the duration of the COVID-19 declaration under Section 564(b)(1) of the Act, 21 U.S.C.section 360bbb-3(b)(1), unless the authorization is terminated  or revoked sooner.       Influenza A by PCR POSITIVE (A) NEGATIVE Final   Influenza B by PCR NEGATIVE NEGATIVE Final    Comment: (NOTE) The Xpert Xpress SARS-CoV-2/FLU/RSV plus assay is intended as an aid in the diagnosis of influenza from Nasopharyngeal swab specimens and should not be used as a sole basis for treatment. Nasal  washings and aspirates are unacceptable for Xpert Xpress SARS-CoV-2/FLU/RSV testing.  Fact Sheet for Patients: bloggercourse.com  Fact Sheet for Healthcare Providers: seriousbroker.it  This test is not yet approved or cleared by the [place]  FDA and has been authorized for detection and/or diagnosis of SARS-CoV-2 by FDA under an Emergency Use Authorization (EUA). This EUA will remain in effect (meaning this test can be used) for the duration of the COVID-19 declaration under Section 564(b)(1) of the Act, 21 U.S.C. section 360bbb-3(b)(1), unless the authorization is terminated or revoked.     Resp Syncytial Virus by PCR NEGATIVE NEGATIVE Final  Comment: (NOTE) Fact Sheet for Patients: bloggercourse.com  Fact Sheet for Healthcare Providers: seriousbroker.it  This test is not yet approved or cleared by the [place]  FDA and has been authorized for detection and/or diagnosis of SARS-CoV-2 by FDA under an Emergency Use Authorization (EUA). This EUA will remain in effect (meaning this test can be used) for the duration of the COVID-19 declaration under Section 564(b)(1) of the Act, 21 U.S.C. section 360bbb-3(b)(1), unless the authorization is terminated or revoked.  Performed at [facility], [address], [place], [place] [address]   Culture, blood (Routine X 2) w Reflex to ID Panel     Status: None   Collection Time: 01/10/24  8:33 AM   Specimen: BLOOD  Result Value Ref Range Status   Specimen Description   Final    BLOOD SITE NOT SPECIFIED Performed at [facility], [address], [place], [place] [address]    Special Requests   Final    BOTTLES DRAWN AEROBIC AND ANAEROBIC Blood Culture adequate volume Performed at [facility], [address], [place], [place] [address]    Culture   Final    NO GROWTH  5 DAYS Performed at [facility], [address] [place], [place] [address]    Report Status 01/15/2024 FINAL  Final  Culture, blood (Routine X 2) w Reflex to ID Panel     Status: None (Preliminary result)   Collection Time: 01/10/24  7:09 PM   Specimen: BLOOD RIGHT HAND  Result Value Ref Range Status   Specimen Description   Final    BLOOD RIGHT HAND Performed at [facility], [address] [place], [place] [address]    Special Requests   Final    BOTTLES DRAWN AEROBIC AND ANAEROBIC Blood Culture adequate volume Performed at [facility], [address] [place], [place] [address]    Culture   Final    NO GROWTH 4 DAYS Performed at [facility], [address], [place], [place] [address]    Report Status PENDING  Incomplete         Radiology Studies: No results found.   Scheduled Meds:  albuterol   2.5 mg Nebulization TID   amLODipine   5 mg Oral Daily   atorvastatin   40 mg Oral QHS   carvedilol   25 mg Oral BID WC   dextromethorphan -guaiFENesin   1 tablet Oral BID   diclofenac  Sodium  2 g Topical QID   doxazosin   4 mg Oral BID   feeding supplement  237 mL Oral BID BM   ferrous sulfate   325 mg Oral QODAY   heparin   5,000 Units Subcutaneous Q8H   insulin  aspart  0-15 Units Subcutaneous TID WC   latanoprost   1 drop Both Eyes QHS   multivitamin  15 mL Oral Daily   pantoprazole   40 mg Oral QHS   sertraline   50 mg Oral QHS   timolol   1 drop Both Eyes q AM   Vitamin D  (Ergocalciferol )  50,000 Units Oral Q7 days   Continuous Infusions:  promethazine  (PHENERGAN ) injection (IM or IVPB) 12.5 mg (01/12/24 0426)   sodium bicarbonate  150 mEq in sterile water  1,150 mL infusion 100 mL/hr at 01/15/24 1143     LOS: 5 days    Almarie KANDICE Hoots, MD  01/15/2024, 11:48 AM   "

## 2024-01-15 NOTE — Plan of Care (Signed)

## 2024-01-15 NOTE — Progress Notes (Signed)
 Physical Therapy Treatment Patient Details Name: Madison Edwards MRN: [id number] DOB: 1945-05-31 Today's Date: 01/15/2024   History of Present Illness Madison Edwards is a 19 yr olf female admitted to the hospital on 01-09-24 with weakness, malaise, and hypoglycemia. She was found to have influenza A, AKI on CKD IV, and hypertensive urgency. PMH: DM II, HTN, HLD, obesity, CKD IV, GERD, IDA    PT Comments   Pt admitted with above diagnosis.  Pt currently with functional limitations due to the deficits listed below (see PT Problem List). Pt in bed when PT arrived. Pt agreeable to therapy intervention. Pt demonstrated increased IND with all mobility tasks and able to progress with gait trial in personal room. No reports of SOB, pt reports L UE pain secondary to OA and may benefit from trial with L PFRW. PT modified goals and added goal for gait today. Pt left seated in recliner, all needs in place.  Patient will benefit from continued inpatient follow up therapy, <3 hours/day. Pt will benefit from acute skilled PT to increase their independence and safety with mobility to allow discharge.      If plan is discharge home, recommend the following: A lot of help with walking and/or transfers;A lot of help with bathing/dressing/bathroom;Assist for transportation;Help with stairs or ramp for entrance;Assistance with cooking/housework   Can travel by private vehicle     No  Equipment Recommendations  None recommended by PT;Other (comment) (defer to next level of care for DME recommendations)    Recommendations for Other Services       Precautions / Restrictions Precautions Precautions: Fall Recall of Precautions/Restrictions: Intact Restrictions Weight Bearing Restrictions Per Provider Order: No     Mobility  Bed Mobility Overal bed mobility: Needs Assistance Bed Mobility: Supine to Sit     Supine to sit: Min assist, HOB elevated, Used rails     General bed mobility comments: min A and  increased time with multimodal cues and encouragement for IND    Transfers Overall transfer level: Needs assistance Equipment used: Rolling walker (2 wheels) Transfers: Sit to/from Stand Sit to Stand: Min assist, +2 physical assistance, +2 safety/equipment, From elevated surface           General transfer comment: cues and increased time, pt reporting L UE discomfort and difficulty pushing though L UE for sit to stand and when in standing WB on RA    Ambulation/Gait Ambulation/Gait assistance: Min assist, +2 safety/equipment (chair follow) Gait Distance (Feet): 10 Feet Assistive device: Rolling walker (2 wheels) Gait Pattern/deviations: Step-to pattern, Shuffle, Trunk flexed, Wide base of support Gait velocity: decreased     General Gait Details: min cues for safety, RW management with pt limited with L UE WB due to pain, may benefit from L PFRW at next session.   Stairs             Wheelchair Mobility     Tilt Bed    Modified Rankin (Stroke Patients Only)       Balance Overall balance assessment: Needs assistance Sitting-balance support: Feet supported, Bilateral upper extremity supported Sitting balance-Leahy Scale: Good     Standing balance support: Bilateral upper extremity supported, During functional activity, Reliant on assistive device for balance Standing balance-Leahy Scale: Poor Standing balance comment: static standing CGA and min A for dynamic with B UE support at [facility]  Communication Communication Communication: No apparent difficulties  Cognition Arousal: Alert Behavior During Therapy: WFL for tasks assessed/performed   PT - Cognitive impairments: No apparent impairments                         Following commands: Intact      Cueing Cueing Techniques: Verbal cues  Exercises      General Comments        Pertinent Vitals/Pain Pain Assessment Pain Assessment: Faces (no apparent pain  at rest, OA L hand and wrist with mobility tasks) Faces Pain Scale: Hurts a little bit Pain Location: L UE Pain Descriptors / Indicators: Burning, Constant, Guarding Pain Intervention(s): Limited activity within patient's tolerance, Monitored during session, Repositioned    Home Living                          Prior Function            PT Goals (current goals can now be found in the care plan section) Acute Rehab PT Goals Patient Stated Goal: be able to walk again PT Goal Formulation: With patient/family Time For Goal Achievement: 01/26/24 Potential to Achieve Goals: Good Progress towards PT goals: Progressing toward goals    Frequency    Min 2X/week      PT Plan      Co-evaluation              AM-PAC PT 6 Clicks Mobility   Outcome Measure  Help needed turning from your back to your side while in a flat bed without using bedrails?: A Little Help needed moving from lying on your back to sitting on the side of a flat bed without using bedrails?: A Little Help needed moving to and from a bed to a chair (including a wheelchair)?: A Little Help needed standing up from a chair using your arms (e.g., wheelchair or bedside chair)?: A Little Help needed to walk in hospital room?: A Little Help needed climbing 3-5 steps with a railing? : Total 6 Click Score: 16    End of Session Equipment Utilized During Treatment: Gait belt Activity Tolerance: Patient tolerated treatment well Patient left: in bed;with call bell/phone within reach;with chair alarm set Nurse Communication: Mobility status PT Visit Diagnosis: Unsteadiness on feet (R26.81);Muscle weakness (generalized) (M62.81);History of falling (Z91.81);Other abnormalities of gait and mobility (R26.89)     Time: 8780-8759 PT Time Calculation (min) (ACUTE ONLY): 21 min  Charges:    $Gait Training: 8-22 mins PT General Charges $$ ACUTE PT VISIT: 1 Visit                     Madison, PT Acute  Rehab    Madison Edwards 01/15/2024, 5:52 PM

## 2024-01-16 LAB — GLUCOSE, CAPILLARY
Glucose-Capillary: 118 mg/dL — ABNORMAL HIGH (ref 70–99)
Glucose-Capillary: 178 mg/dL — ABNORMAL HIGH (ref 70–99)
Glucose-Capillary: 207 mg/dL — ABNORMAL HIGH (ref 70–99)
Glucose-Capillary: 230 mg/dL — ABNORMAL HIGH (ref 70–99)

## 2024-01-16 LAB — CULTURE, BLOOD (ROUTINE X 2)
Culture: NO GROWTH
Special Requests: ADEQUATE

## 2024-01-16 LAB — BASIC METABOLIC PANEL WITH GFR
Anion gap: 10 (ref 5–15)
BUN: 46 mg/dL — ABNORMAL HIGH (ref 8–23)
CO2: 28 mmol/L (ref 22–32)
Calcium: 9.2 mg/dL (ref 8.9–10.3)
Chloride: 98 mmol/L (ref 98–111)
Creatinine, Ser: 3.17 mg/dL — ABNORMAL HIGH (ref 0.44–1.00)
GFR, Estimated: 14 mL/min — ABNORMAL LOW
Glucose, Bld: 125 mg/dL — ABNORMAL HIGH (ref 70–99)
Potassium: 4.4 mmol/L (ref 3.5–5.1)
Sodium: 136 mmol/L (ref 135–145)

## 2024-01-16 MED ORDER — AMLODIPINE BESYLATE 10 MG PO TABS
10.0000 mg | ORAL_TABLET | Freq: Every day | ORAL | Status: DC
  Administered 2024-01-17 – 2024-01-19 (×3): 10 mg via ORAL
  Filled 2024-01-16 (×3): qty 1

## 2024-01-16 NOTE — Progress Notes (Signed)
 [facility] Progress Note  Name: Madison Edwards MRN: [id number] DOB: [birth year]  Subjective:  - UOP 600 cc yesterday - creat down to 3.1 today   Presentation summary: 79 y.o. female with a history of CKD stage IV, type 2 DM, morbid obesity, and HTN who presented to the hospital with several days of weakness as well as cough, chills, and sore throat.  She had also been having decreased PO intake for the past couple of days.  She was found to have hypoglycemia with sugars in the 40's and 50's initially.  She was on a D10 infusion earlier.  She was diagnosed with Flu A. Got a 1 liter NS bolus on 12/30.  Creatinine has been rising recently.  Cr rose from 2.63 on admission to 2.86 and ultimately to 3.47 today.  (Note Cr 12/11 was 2.7.)   Nephrology is consulted for assistance with management of AKI.  Home meds include losartan -hydrochlorothiazide  and mounjaro .  She had a bladder scan earlier today with 104 mL. Renal US  without hydro and mild inc echogenicity.    Vitals:  Vitals:   01/15/24 2109 01/16/24 0500 01/16/24 0516 01/16/24 0944  BP:   (!) 174/79 (!) 174/79  Pulse:   72 72  Resp:   20   Temp:   98.2 F (36.8 C)   TempSrc:   Oral   SpO2: 98%  100%   Weight:  99.5 kg       Physical Exam:  General adult female no distress Lungs clear to auscultation bilat, dec'd at bases Heart S1S2 no rub Abdomen soft nontender obese habitus Extremities no pitting edema  Neuro - alert and oriented x provides hx and follows commands     Assessment/Plan:   # AKI  - Secondary to pre-renal insults in the setting of flu A, poor po and ARB - hold ARB 1-2 months - BP's are stable - creat ^'d when IVF's were stopped so IVFs resumed - creat continues to improve down to 3.1 today (peak 3.9) - f/u CXR yest was clear - will lower IVFs to 50 cc/hr - pmd can taper off as her PO intake improves - will ask office to schedule f/u appt w/ Dr Norine in 3-4 wks, will sign off   # CKD  stage IV  - Baseline Cr 2.7, eGFR 18 ml/min, in early dec 2025  # Hyponatremia   - avoid hydrochlorothiazide  as a rule - okay to retry zoloft / SSRI's in my opinion - this was partially hypovolemic and responded to IVF's - resolved  # HTN  - hold losartan  1-2 mos - on coreg , norvasc  and cardura  - bp's good 150/80 range - avoid hydrochlorothiazide , listed as intolerance   # Influenza A infection - on tamiflu , dose was reduced due to CKD  - supportive care per primary team    Rob Geralynn  MD  [facility] 01/16/2024, 4:26 PM  Recent Labs  Lab 01/11/24 0601 01/12/24 0600 01/13/24 1154 01/14/24 0606 01/14/24 1412 01/15/24 0530 01/16/24 0543  HGB 8.8* 9.0* 9.0* 8.5*  --   --   --   ALBUMIN 3.3* 3.4*  --   --   --   --   --   CALCIUM  8.2* 8.4* 8.2* 8.3*   < > 8.3* 9.2  PHOS  --  4.1  --   --   --   --   --   CREATININE 3.47* 3.51* 3.70* 3.89*   < > 3.69* 3.17*  K 4.5 4.9 4.7  4.5   < > 4.5 4.4   < > = values in this interval not displayed.    Inpatient medications:  albuterol   2.5 mg Nebulization TID   [START ON 01/17/2024] amLODipine   10 mg Oral Daily   atorvastatin   40 mg Oral QHS   carvedilol   25 mg Oral BID WC   dextromethorphan -guaiFENesin   1 tablet Oral BID   diclofenac  Sodium  2 g Topical QID   doxazosin   4 mg Oral BID   feeding supplement  237 mL Oral BID BM   ferrous sulfate   325 mg Oral QODAY   heparin   5,000 Units Subcutaneous Q8H   insulin  aspart  0-15 Units Subcutaneous TID WC   latanoprost   1 drop Both Eyes QHS   multivitamin  15 mL Oral Daily   pantoprazole   40 mg Oral QHS   sertraline   50 mg Oral QHS   timolol   1 drop Both Eyes q AM   Vitamin D  (Ergocalciferol )  50,000 Units Oral Q7 days    promethazine  (PHENERGAN ) injection (IM or IVPB) 12.5 mg (01/12/24 0426)   acetaminophen  **OR** acetaminophen , bisacodyl , calcium  carbonate, hydrALAZINE , menthol , ondansetron  **OR** ondansetron  (ZOFRAN ) IV, promethazine  (PHENERGAN ) injection (IM or IVPB), senna-docusate,  sodium chloride 

## 2024-01-16 NOTE — Progress Notes (Signed)
 " PROGRESS NOTE    Madison Edwards  FMW:[id number] DOB: 1945-02-14 DOA: 01/09/2024 PCP: Leonarda Roxan BROCKS, NP    Brief Narrative:  79 y.o. female with medical history significant for T2DM, HTN, HLD, morbid obesity, CKD stage IV, GERD, IDA and and depression who presented to the ED for evaluation of hypoglycemia and generalized weakness. Per spouse, patient has had generalized weakness and malaise and found her breathing heavy with poor appetite and nausea.  He was found to have influenza, hypoglycemia and AKI and was admitted for the same. Assessment & Plan:   Principal Problem:   Influenza A Active Problems:   Morbid obesity due to excess calories (HCC)   Hypoglycemia   Hypertensive urgency   Type 2 diabetes mellitus with hypoglycemia without coma, without long-term current use of insulin  (HCC)   Mixed hyperlipidemia   # Influenza A infection - Patient presented with 2 days of worsening weakness and malaise with associated productive cough and cold chills - Found to have positive influenza A by PCR, CXR without signs of pneumonia - She finished a course of renally dosed Tamiflu . - Symptomatic management with scheduled Mucinex , nebulizer and as needed Tylenol  - Droplet precautions -[facility] SN[facility]   # Hypoglycemia now hyperglycemic # Hx of T2DM-hypoglycemia has resolved after multiple D50 and D10 infusion since admission. Patient takes tirzepatide  and oral agents at home continue to hold them will continue with insulin .  Patient has been not tolerating p.o. intake has not been really eating due to nausea.   Started insulin  sliding scale  CBG (last 3)  Recent Labs    01/15/24 2100 01/16/24 0739 01/16/24 1326  GLUCAP 104* 118* 178*    # Hyponatremia resolved.DC  Hydrochlorothiazide  on discharge. (She had received isotonic fluids D5 and D10's initially since she came in with hypoglycemia which caused hyponatremia.)  #  Hypertensive urgency -resolved.   Continue Coreg  amlodipine  and Cardura .   Norvasc  dose increased to 10 mg on 01/16/2024 Holding ACE and HCTZ.     # AKI on CKD stage IV creatinine 3.51 from 3.47 from 2.86 yesterday.   Creatinine finally trending down and plateaued.  Renal recommends to hold ARB's and ACE for at least 1 to 2 months. HCTZ and ACE inhibitors were on hold and she was getting IV fluids.   Patient has PureWick in place.   Renal ultrasound no hydronephrosis.  Nephrology consulted and appreciated much.   Receiving IV fluids per nephrology  # Right paratracheal soft tissue prominence  - CXR identified a right paratracheal soft tissue prominence, CT chest recommended for further characterization - Can consider CT chest as outpatient(hopefully with improved renal functions)  # HLD - Continue atorvastatin    # GERD - Continue Protonix    # Depression - Continue sertraline    # Generalized weakness - In the setting of acute illness - PT recommends SNF she will need to go to [place] [facility] in progress TOC aware  # Morbid obesity 99.5 kg - Follow-up with PCP for nutrition and weight loss counseling    Estimated body mass index is 38.25 kg/m as calculated from the following:   Height as of 09/20/23: 5' 3.5 (1.613 m).   Weight as of this encounter: 99.5 kg.  DVT prophylaxis:lovenox Code Status:full Family Communication:none Disposition Plan:  Status is: Inpatient    Consultants: none  Procedures: none Antimicrobials: Finished course of Tamiflu   Subjective:    Resting in bed husband at bedside  she feels less nauseous ate something for breakfast yesterday  Objective: Vitals:   01/15/24 2109 01/16/24 0500 01/16/24 0516 01/16/24 0944  BP:   (!) 174/79 (!) 174/79  Pulse:   72 72  Resp:   20   Temp:   98.2 F (36.8 C)   TempSrc:   Oral   SpO2: 98%  100%   Weight:  99.5 kg      Intake/Output Summary (Last 24 hours) at 01/16/2024 1358 Last data filed at 01/15/2024 2110 Gross  per 24 hour  Intake 408.62 ml  Output 700 ml  Net -291.38 ml   Filed Weights   01/14/24 0455 01/15/24 0534 01/16/24 0500  Weight: 108.6 kg 99.8 kg 99.5 kg    Examination:  General exam: Appears chronically ill-appearing in mild distress due to nausea Respiratory system: Clear to auscultation. Respiratory effort normal.  Decreased breath sounds Cardiovascular system:reg Gastrointestinal system: Abdomen is nondistended, soft and nontender. No organomegaly or masses felt. Normal bowel sounds heard. Central nervous system: Alert and oriented.  Extremities: no edema  Data Reviewed: I have personally reviewed following labs and imaging studies  CBC: Recent Labs  Lab 01/09/24 1648 01/10/24 0302 01/11/24 0601 01/12/24 0600 01/13/24 1154 01/14/24 0606  WBC 7.8 7.3 8.7 6.4 6.9 7.4  NEUTROABS 5.7  --   --   --   --   --   HGB 9.5* 8.5* 8.8* 9.0* 9.0* 8.5*  HCT 31.8* 28.4* 28.4* 29.1* 28.7* 26.3*  MCV 79.9* 79.8* 77.4* 76.4* 75.7* 75.8*  PLT 163 147* 141* 159 163 155   Basic Metabolic Panel: Recent Labs  Lab 01/12/24 0600 01/13/24 1154 01/14/24 0606 01/14/24 1412 01/14/24 1828 01/15/24 0530 01/16/24 0543  NA 124*   < > 130* 132* 131* 134* 136  K 4.9   < > 4.5 4.3 4.3 4.5 4.4  CL 93*   < > 98 97* 98 97* 98  CO2 19*   < > 20* 24 23 27 28   GLUCOSE 175*   < > 138* 202* 151* 147* 125*  BUN 47*   < > 55* 59* 60* 56* 46*  CREATININE 3.51*   < > 3.89* 3.84* 3.82* 3.69* 3.17*  CALCIUM  8.4*   < > 8.3* 8.3* 8.1* 8.3* 9.2  PHOS 4.1  --   --   --   --   --   --    < > = values in this interval not displayed.   GFR: Estimated Creatinine Clearance: 16.6 mL/min (A) (by C-G formula based on SCr of 3.17 mg/dL (H)). Liver Function Tests: Recent Labs  Lab 01/09/24 1648 01/10/24 0302 01/11/24 0601 01/12/24 0600  AST 53* 45* 39 33  ALT 21 18 18 19   ALKPHOS 140* 116 113 113  BILITOT 0.3 0.2 0.3 0.3  PROT 7.4 6.3* 6.3* 6.4*  ALBUMIN 4.1 3.5 3.3* 3.4*   No results for input(s):  LIPASE, AMYLASE in the last 168 hours. No results for input(s): AMMONIA in the last 168 hours. Coagulation Profile: No results for input(s): INR, PROTIME in the last 168 hours. Cardiac Enzymes: No results for input(s): CKTOTAL, CKMB, CKMBINDEX, TROPONINI in the last 168 hours. BNP (last 3 results) No results for input(s): PROBNP in the last 8760 hours. HbA1C: No results for input(s): HGBA1C in the last 72 hours.  CBG: Recent Labs  Lab 01/15/24 1124 01/15/24 1621 01/15/24 2100 01/16/24 0739 01/16/24 1326  GLUCAP 192* 207* 104* 118* 178*   Lipid Profile: No results for input(s): CHOL, HDL,  LDLCALC, TRIG, CHOLHDL, LDLDIRECT in the last 72 hours. Thyroid Function Tests: No results for input(s): TSH, T4TOTAL, FREET4, T3FREE, THYROIDAB in the last 72 hours. Anemia Panel: No results for input(s): VITAMINB12, FOLATE, FERRITIN, TIBC, IRON, RETICCTPCT in the last 72 hours. Sepsis Labs: No results for input(s): PROCALCITON, LATICACIDVEN in the last 168 hours.  Recent Results (from the past 240 hours)  Resp panel by RT-PCR (RSV, Flu A&B, Covid) Anterior Nasal Swab     Status: Abnormal   Collection Time: 01/09/24  4:51 PM   Specimen: Anterior Nasal Swab  Result Value Ref Range Status   SARS Coronavirus 2 by RT PCR NEGATIVE NEGATIVE Final    Comment: (NOTE) SARS-CoV-2 target nucleic acids are NOT DETECTED.  The SARS-CoV-2 RNA is generally detectable in upper respiratory specimens during the acute phase of infection. The lowest concentration of SARS-CoV-2 viral copies this assay can detect is 138 copies/mL. A negative result does not preclude SARS-Cov-2 infection and should not be used as the sole basis for treatment or other patient management decisions. A negative result may occur with  improper specimen collection/handling, submission of specimen other than nasopharyngeal swab, presence of viral mutation(s) within  the areas targeted by this assay, and inadequate number of viral copies(<138 copies/mL). A negative result must be combined with clinical observations, patient history, and epidemiological information. The expected result is Negative.  Fact Sheet for Patients:  bloggercourse.com  Fact Sheet for Healthcare Providers:  seriousbroker.it  This test is no t yet approved or cleared by the [place]  FDA and  has been authorized for detection and/or diagnosis of SARS-CoV-2 by FDA under an Emergency Use Authorization (EUA). This EUA will remain  in effect (meaning this test can be used) for the duration of the COVID-19 declaration under Section 564(b)(1) of the Act, 21 U.S.C.section 360bbb-3(b)(1), unless the authorization is terminated  or revoked sooner.       Influenza A by PCR POSITIVE (A) NEGATIVE Final   Influenza B by PCR NEGATIVE NEGATIVE Final    Comment: (NOTE) The Xpert Xpress SARS-CoV-2/FLU/RSV plus assay is intended as an aid in the diagnosis of influenza from Nasopharyngeal swab specimens and should not be used as a sole basis for treatment. Nasal washings and aspirates are unacceptable for Xpert Xpress SARS-CoV-2/FLU/RSV testing.  Fact Sheet for Patients: bloggercourse.com  Fact Sheet for Healthcare Providers: seriousbroker.it  This test is not yet approved or cleared by the [place]  FDA and has been authorized for detection and/or diagnosis of SARS-CoV-2 by FDA under an Emergency Use Authorization (EUA). This EUA will remain in effect (meaning this test can be used) for the duration of the COVID-19 declaration under Section 564(b)(1) of the Act, 21 U.S.C. section 360bbb-3(b)(1), unless the authorization is terminated or revoked.     Resp Syncytial Virus by PCR NEGATIVE NEGATIVE Final    Comment: (NOTE) Fact Sheet for  Patients: bloggercourse.com  Fact Sheet for Healthcare Providers: seriousbroker.it  This test is not yet approved or cleared by the [place]  FDA and has been authorized for detection and/or diagnosis of SARS-CoV-2 by FDA under an Emergency Use Authorization (EUA). This EUA will remain in effect (meaning this test can be used) for the duration of the COVID-19 declaration under Section 564(b)(1) of the Act, 21 U.S.C. section 360bbb-3(b)(1), unless the authorization is terminated or revoked.  Performed at [facility] [address], [place], [place] [address]   Culture, blood (Routine X 2) w Reflex to ID Panel  Status: None   Collection Time: 01/10/24  8:33 AM   Specimen: BLOOD  Result Value Ref Range Status   Specimen Description   Final    BLOOD SITE NOT SPECIFIED Performed at [facility], [address] [place], [place] [address]    Special Requests   Final    BOTTLES DRAWN AEROBIC AND ANAEROBIC Blood Culture adequate volume Performed at [facility], [address], [place], [place] [address]    Culture   Final    NO GROWTH 5 DAYS Performed at [facility], [address] [place], [place] [address]    Report Status 01/15/2024 FINAL  Final  Culture, blood (Routine X 2) w Reflex to ID Panel     Status: None   Collection Time: 01/10/24  7:09 PM   Specimen: BLOOD RIGHT HAND  Result Value Ref Range Status   Specimen Description   Final    BLOOD RIGHT HAND Performed at [facility], [address], [place], [place] [address]    Special Requests   Final    BOTTLES DRAWN AEROBIC AND ANAEROBIC Blood Culture adequate volume Performed at [facility], [address] [place], [place] [address]    Culture   Final    NO GROWTH 5 DAYS Performed at [facility], [address], [place], [place] [address]    Report Status  01/16/2024 FINAL  Final         Radiology Studies: DG CHEST PORT 1 VIEW Result Date: 01/15/2024 EXAM: 1 VIEW(S) XRAY OF THE CHEST 01/15/2024 10:50:00 AM COMPARISON: 01/11/2024 CLINICAL HISTORY: Follow-up exam FINDINGS: LUNGS AND PLEURA: No focal pulmonary opacity. No vascular congestion or edema. Probable small left pleural effusion with basilar atelectasis. No pneumothorax. Similar appearance to previous study. HEART AND MEDIASTINUM: No acute abnormality of the cardiac and mediastinal silhouettes. Calcification of the aorta. BONES AND SOFT TISSUES: No acute osseous abnormality. IMPRESSION: 1. Small left pleural effusion with basilar atelectasis, similar to previous study. Electronically signed by: Elsie Gravely MD 01/15/2024 08:30 PM EST RP Workstation: [id number]     Scheduled Meds:  albuterol   2.5 mg Nebulization TID   amLODipine   5 mg Oral Daily   atorvastatin   40 mg Oral QHS   carvedilol   25 mg Oral BID WC   dextromethorphan -guaiFENesin   1 tablet Oral BID   diclofenac  Sodium  2 g Topical QID   doxazosin   4 mg Oral BID   feeding supplement  237 mL Oral BID BM   ferrous sulfate   325 mg Oral QODAY   heparin   5,000 Units Subcutaneous Q8H   insulin  aspart  0-15 Units Subcutaneous TID WC   latanoprost   1 drop Both Eyes QHS   multivitamin  15 mL Oral Daily   pantoprazole   40 mg Oral QHS   sertraline   50 mg Oral QHS   timolol   1 drop Both Eyes q AM   Vitamin D  (Ergocalciferol )  50,000 Units Oral Q7 days   Continuous Infusions:  promethazine  (PHENERGAN ) injection (IM or IVPB) 12.5 mg (01/12/24 0426)     LOS: 6 days    Madison KANDICE Hoots, MD  01/16/2024, 1:58 PM   "

## 2024-01-16 NOTE — Plan of Care (Signed)
  Problem: Health Behavior/Discharge Planning: Goal: Ability to manage health-related needs will improve Outcome: Progressing   Problem: Activity: Goal: Risk for activity intolerance will decrease Outcome: Progressing   Problem: Nutrition: Goal: Adequate nutrition will be maintained Outcome: Progressing   

## 2024-01-16 NOTE — TOC Progression Note (Signed)
 Transition of Care [facility]) - Progression Note    Patient Details  Name: Madison Edwards MRN: [id number] Date of Birth: 18-Jul-1945  [facility] CM/SW Contact  Heather DELENA Saltness, LCSW Phone Number: 01/16/2024, 12:35 PM  Clinical Narrative:    CSW attempted to speak with [facility] coordinator, Bernarda Jump [phone], ext. [phone], via phone call regarding pt's referral status for SNF rehab. No answer, voicemail left requesting return phone call.   Expected Discharge Plan: Skilled Nursing Facility Barriers to Discharge: Continued Medical Work up, SNF Pending bed offer  Expected Discharge Plan and Services In-house Referral: Clinical Social Work Discharge Planning Services: NA Post Acute Care Choice: Skilled Nursing Facility Living arrangements for the past 2 months: Single Family Home                   Social Drivers of Health (SDOH) Interventions SDOH Screenings   Food Insecurity: Unknown (01/10/2024)  Housing: Unknown (01/10/2024)  Transportation Needs: Unknown (01/10/2024)  Utilities: Not At Risk (01/10/2024)  Depression (PHQ2-9): Low Risk (08/16/2023)  Recent Concern: Depression (PHQ2-9) - Medium Risk (06/14/2023)  Social Connections: Unknown (01/10/2024)  Tobacco Use: Unknown (01/09/2024)    Readmission Risk Interventions    01/14/2024    1:41 PM  Readmission Risk Prevention Plan  Transportation Screening Complete  PCP or Specialist Appt within 5-7 Days Complete  Home Care Screening Complete  Medication Review (RN CM) Complete    Signed: Heather Saltness, MSW, LCSW Clinical Social Worker Inpatient Care Management 01/16/2024 12:36 PM

## 2024-01-16 NOTE — Plan of Care (Signed)

## 2024-01-17 LAB — BASIC METABOLIC PANEL WITH GFR
Anion gap: 8 (ref 5–15)
BUN: 43 mg/dL — ABNORMAL HIGH (ref 8–23)
CO2: 27 mmol/L (ref 22–32)
Calcium: 8.9 mg/dL (ref 8.9–10.3)
Chloride: 99 mmol/L (ref 98–111)
Creatinine, Ser: 3.22 mg/dL — ABNORMAL HIGH (ref 0.44–1.00)
GFR, Estimated: 14 mL/min — ABNORMAL LOW
Glucose, Bld: 155 mg/dL — ABNORMAL HIGH (ref 70–99)
Potassium: 5 mmol/L (ref 3.5–5.1)
Sodium: 135 mmol/L (ref 135–145)

## 2024-01-17 LAB — GLUCOSE, CAPILLARY
Glucose-Capillary: 137 mg/dL — ABNORMAL HIGH (ref 70–99)
Glucose-Capillary: 207 mg/dL — ABNORMAL HIGH (ref 70–99)
Glucose-Capillary: 164 mg/dL — ABNORMAL HIGH (ref 70–99)
Glucose-Capillary: 222 mg/dL — ABNORMAL HIGH (ref 70–99)

## 2024-01-17 MED ORDER — TRAMADOL HCL 50 MG PO TABS
25.0000 mg | ORAL_TABLET | Freq: Once | ORAL | Status: AC | PRN
  Administered 2024-01-17: 25 mg via ORAL
  Filled 2024-01-17: qty 1

## 2024-01-17 MED ORDER — SODIUM CHLORIDE 0.9 % IV SOLN: INTRAVENOUS | Status: AC

## 2024-01-17 MED ORDER — LIDOCAINE 5 % EX PTCH: 1.0000 | MEDICATED_PATCH | Freq: Once | CUTANEOUS | Status: DC

## 2024-01-17 NOTE — Plan of Care (Signed)

## 2024-01-17 NOTE — TOC Progression Note (Signed)
 Transition of Care [facility]) - Progression Note    Patient Details  Name: Madison Edwards MRN: [id number] Date of Birth: 1945/11/10  [facility]) CM/SW Contact  Heather DELENA Saltness, LCSW Phone Number: 01/17/2024, 4:27 PM  Clinical Narrative:    CSW received phone call from [place] [phone] ext. [phone], child psychotherapist, at [facility], regarding pt's request for SNF rehab. [place] reports pt is not enrolled in [place] [facility] so they cannot accept referral. [facility] advised pt to use private insurance for SNF rehab. CSW spoke with pt at bedside to provide update. Pt agreeable to using [facility] Medicare to cover SNF rehab. CSW completed FL2 and sent referrals to SNF locations in [place] and surrounding areas. Currently pending bed offers. TOC will continue to follow.   Expected Discharge Plan: Skilled Nursing Facility Barriers to Discharge: Continued Medical Work up, SNF Pending bed offer   Expected Discharge Plan and Services In-house Referral: Clinical Social Work Discharge Planning Services: NA Post Acute Care Choice: Skilled Nursing Facility Living arrangements for the past 2 months: Single Family Home                   Social Drivers of Health (SDOH) Interventions SDOH Screenings   Food Insecurity: Unknown (01/10/2024)  Housing: Unknown (01/10/2024)  Transportation Needs: Unknown (01/10/2024)  Utilities: Not At Risk (01/10/2024)  Depression (PHQ2-9): Low Risk (08/16/2023)  Recent Concern: Depression (PHQ2-9) - Medium Risk (06/14/2023)  Social Connections: Unknown (01/10/2024)  Tobacco Use: Unknown (01/09/2024)    Readmission Risk Interventions    01/14/2024    1:41 PM  Readmission Risk Prevention Plan  Transportation Screening Complete  PCP or Specialist Appt within 5-7 Days Complete  Home Care Screening Complete  Medication Review (RN CM) Complete    Signed: Heather Saltness, MSW, LCSW Clinical Social Worker Inpatient Care Management 01/17/2024 4:34 PM

## 2024-01-17 NOTE — Progress Notes (Signed)
 Physical Therapy Treatment Patient Details Name: Madison Edwards MRN: [id number] DOB: 06/26/1945 Today's Date: 01/17/2024   History of Present Illness Madison Edwards is a 26 yr olf female admitted to the hospital on 01-09-24 with weakness, malaise, and hypoglycemia. She was found to have influenza A, AKI on CKD IV, and hypertensive urgency. PMH: DM II, HTN, HLD, obesity, CKD IV, GERD, IDA    PT Comments  Today's PT session focused on activity/standing tolerance. Pt performed bed mobility with MAX A+2 and sit to stand from slightly elevated surface with MOD A+2. Able to take lateral steps along EOB with increased time and encouragement and manual assist for RW management laterally. Pt able to stand for ~5-82min at a time during session. Heavy forearm/UE support noted on RW during standing requiring repeated multimodal cues and facilitation for upright posture and proper hand placement on RW in standing.   If plan is discharge home, recommend the following: A lot of help with walking and/or transfers;A lot of help with bathing/dressing/bathroom;Assist for transportation;Help with stairs or ramp for entrance;Assistance with cooking/housework   Can travel by private vehicle     No  Equipment Recommendations  None recommended by PT;Other (comment) (defer to next level of care for DME recommendations)    Recommendations for Other Services       Precautions / Restrictions Precautions Precautions: Fall Recall of Precautions/Restrictions: Intact Restrictions Weight Bearing Restrictions Per Provider Order: No     Mobility  Bed Mobility Overal bed mobility: Needs Assistance Bed Mobility: Sit to Supine, Rolling Rolling: Min assist, Used rails     Sit to supine: Max assist, +2 for physical assistance, HOB elevated   General bed mobility comments: Pt sitting EOB with NT and OT upon PT entry. MAX A+2 for back to bed, positioned into R sidelying at end of session for comfort and pressure relief.     Transfers Overall transfer level: Needs assistance Equipment used: Rolling walker (2 wheels) Transfers: Sit to/from Stand Sit to Stand: +2 physical assistance, +2 safety/equipment, From elevated surface, Mod assist           General transfer comment: MOD A +2 to perform STS, pt pulling back on RW, OT/PT blocking feet and RW as appropriate. Pt stands with increased time and  effort, tends to lean heavily on elbows/forearms or front of RW. With extensive RW mgmt and cues, pt does take multiple lateral side steps towards HOB, performed standing marches. Pt able to stand for ~5-34min during session. repeated multimodal cues for upright posture and proper hand placement on RW.    Ambulation/Gait                   Stairs             Wheelchair Mobility     Tilt Bed    Modified Rankin (Stroke Patients Only)       Balance Overall balance assessment: Needs assistance Sitting-balance support: Feet supported, Bilateral upper extremity supported Sitting balance-Leahy Scale: Good     Standing balance support: Bilateral upper extremity supported, During functional activity, Reliant on assistive device for balance Standing balance-Leahy Scale: Poor Standing balance comment: tolerates ~5 mins standing, cues for upright posture and cervical extension, pt uses heavy UE support to lean on RW                            Communication Communication Communication: No apparent difficulties  Cognition Arousal: Lethargic Behavior During  Therapy: Flat affect                             Following commands: Intact      Cueing Cueing Techniques: Verbal cues  Exercises      General Comments        Pertinent Vitals/Pain Pain Assessment Pain Assessment: Faces Faces Pain Scale: Hurts a little bit Pain Location: bottom, generalized Pain Descriptors / Indicators: Discomfort, Dull Pain Intervention(s): Limited activity within patient's tolerance,  Monitored during session, Repositioned    Home Living                          Prior Function            PT Goals (current goals can now be found in the care plan section) Acute Rehab PT Goals Patient Stated Goal: be able to walk again PT Goal Formulation: With patient/family Time For Goal Achievement: 01/26/24 Potential to Achieve Goals: Good Progress towards PT goals: Progressing toward goals    Frequency    Min 2X/week      PT Plan      Co-evaluation   Reason for Co-Treatment: To address functional/ADL transfers;For patient/therapist safety PT goals addressed during session: Mobility/safety with mobility;Balance OT goals addressed during session: ADL's and self-care;Proper use of Adaptive equipment and DME      AM-PAC PT 6 Clicks Mobility   Outcome Measure                   End of Session Equipment Utilized During Treatment: Gait belt Activity Tolerance: Patient tolerated treatment well Patient left: in bed;with call bell/phone within reach;with bed alarm set Nurse Communication: Mobility status;Other (comment) (pt in R sidelying) PT Visit Diagnosis: Unsteadiness on feet (R26.81);Muscle weakness (generalized) (M62.81);History of falling (Z91.81);Other abnormalities of gait and mobility (R26.89)     Time: 8497-8477 PT Time Calculation (min) (ACUTE ONLY): 20 min  Charges:    $Therapeutic Activity: 8-22 mins PT General Charges $$ ACUTE PT VISIT: 1 Visit                     Tinnie BERRY PT, DPT  Acute Rehabilitation Services  Office [phone] 01/17/2024, 5:08 PM

## 2024-01-17 NOTE — Progress Notes (Signed)
" °  Progress Note   Patient: Madison Edwards FMW:[id number] DOB: 17-Feb-1945 DOA: 01/09/2024     7 DOS: the patient was seen and examined on 01/17/2024 at 8:49AM      Brief hospital course: 79 y.o. F with obesity, DM, CKD IV baseline 2.7, and HTN who presented with being found unresponsive.  In the ER, CBG 33, given dextrose  and became more responsive.  Subsequently found to have influenza.  Hospitalization complicated by AKI on CKD.     Assessment and Plan: Acute metabolic encephalopathy Initial presenting complaint.  Due to hypoglycemia. No cognitive impairment at baseline, at presentation was confused and poorly responsive.  Now resolved.   Influenza Admitted and treated with 5 days Tamiflu , now completed.  Still with lingering cough.  Hypoglycemia Type 2 diabetes Now with hyperglycemia - Hold home glimepiride - Continue SS corrections  AKI on CKD IV Cr 2.8 on admission, roughly at baseline.  During hospital stay, worsened to 3.7.  Fluids started and improved  Nephrology consulted, recommended holding losartan  2 months and holding hydrochlorothiazide  for now and outpatient Follow up with Dr. Norine - Resume fluids - Hold losartan  and hydrochlorothiazide   Class III obesity - Hold tirzepatide   Hypertension Hypertensive urgency BP elevated - Continue home amlodipine , carvedilol , doxazosin  - Hold losartan  hydrochlorothiazide    Right paratracheal soft tissue prominence - Outpatietn follow up         Subjective: Still tired, coughing.  No fever.  No respiratory distress.  Poor appetite. Still weak.     Physical Exam: BP (!) 166/69   Pulse 66   Temp 98.5 F (36.9 C) (Oral)   Resp 17   Wt 99.5 kg   SpO2 97%   BMI 38.25 kg/m   OIbese adult female, lying in bed, tired, NAD RRR no murmurs, no LE pitting edema Respriqatory effort appear normal, lungs with good air movement, no rales Abdomen without distension or TTP Tired but oriented x3, generally weak  but nonfocal.  Face symmetric speech fluent.   Data Reviewed: BMP sohws normal electrolytes, renal function unchanged.     Family Communication: None    Disposition: Status is: Inpatient Paitent requires ongoing IV fluids,  trending creatinine, hopefully stable for discharge within 2-3 days to SNF        Author: Lonni SHAUNNA Dalton, MD 01/17/2024 12:57 PM  For on call review [email or web address]    "

## 2024-01-17 NOTE — Hospital Course (Signed)
 79 y.o. F with obesity, DM, CKD IV baseline 2.7, and HTN who presented with being found unresponsive.  In the ER, CBG 33, given dextrose  and became more responsive.  Subsequently found to have influenza.  Hospitalization complicated by AKI on CKD.

## 2024-01-17 NOTE — Plan of Care (Signed)
?  Problem: Health Behavior/Discharge Planning: ?Goal: Ability to manage health-related needs will improve ?Outcome: Progressing ?  ?Problem: Activity: ?Goal: Risk for activity intolerance will decrease ?Outcome: Progressing ?  ?Problem: Elimination: ?Goal: Will not experience complications related to bowel motility ?Outcome: Progressing ?  ?

## 2024-01-17 NOTE — Progress Notes (Signed)
 Occupational Therapy Treatment Patient Details Name: Madison Edwards MRN: [id number] DOB: [birth year] Today's Date: 01/17/2024   History of present illness Madison Edwards is a 47 yr olf female admitted to the hospital on 01-09-24 with weakness, malaise, and hypoglycemia. She was found to have influenza A, AKI on CKD IV, and hypertensive urgency. PMH: DM II, HTN, HLD, obesity, CKD IV, GERD, IDA   OT comments  Pt making gradual progress towards goals. Requires max encouragement to arouse and participate. MAX A for bed mobility with step-by-step cues to initiate task and bring BLE towards EOB. PT entering room and pt is able to stand with MOD A +2. Pt limited by generalized weakness, decreased functional activity tolerance and body habitus to perform ADLs efficiently. Pt tolerates standing with heavy UE/forearm support for ~5 mins using RW, is able to take lateral sidesteps MOD A +2, requires manual facilitation and step-by-step cues for foot placement, MAX A to return to supine and re-position in sidelying for pressure relief. Discharge recommendation appropriate, patient will benefit from continued inpatient follow up therapy, <3 hours/day       If plan is discharge home, recommend the following:  Two people to help with walking and/or transfers;A lot of help with bathing/dressing/bathroom;Assist for transportation;Help with stairs or ramp for entrance;Assistance with cooking/housework   Equipment Recommendations  BSC/3in1       Precautions / Restrictions Precautions Precautions: Fall Recall of Precautions/Restrictions: Intact Restrictions Weight Bearing Restrictions Per Provider Order: No       Mobility Bed Mobility Overal bed mobility: Needs Assistance Bed Mobility: Supine to Sit     Supine to sit: Max assist     General bed mobility comments: MAX A, step by step cues for initiation and therapist bringing each leg over across the bed    Transfers Overall transfer level: Needs  assistance Equipment used: Rolling walker (2 wheels) Transfers: Sit to/from Stand Sit to Stand: +2 physical assistance, +2 safety/equipment, From elevated surface, Mod assist           General transfer comment: MOD A +2 to perform STS, pt pulling back on RW, OT/PT blocking feet and RW as appropriate. Pt stands wth increased time + effort, tends to lean heavily on forearms or front of RW. With extensive RW mgmt and cues, pt does take lateral sidesteps towards HOB.     Balance Overall balance assessment: Needs assistance Sitting-balance support: Feet supported, Bilateral upper extremity supported Sitting balance-Leahy Scale: Good     Standing balance support: Bilateral upper extremity supported, During functional activity, Reliant on assistive device for balance Standing balance-Leahy Scale: Poor Standing balance comment: tolerates ~5 mins standing, cues for upright posture and cervical extension, pt uses heavy UE support to lean on RW                           ADL either performed or assessed with clinical judgement   ADL Overall ADL's : Needs assistance/impaired                                     Functional mobility during ADLs: +2 for physical assistance;+2 for safety/equipment;Maximal assistance General ADL Comments: continues to require MAX - TOTAL for LB ADL mgmt, session focused on functional mobility     Communication Communication Communication: No apparent difficulties   Cognition Arousal: Lethargic Behavior During Therapy: Flat affect Cognition: Difficult to assess  Difficult to assess due to: Level of arousal           OT - Cognition Comments: flat affect, difficult to arouse, pt self-limiting                 Following commands: Intact        Cueing   Cueing Techniques: Verbal cues             Pertinent Vitals/ Pain       Pain Assessment Pain Assessment: Faces Faces Pain Scale: Hurts a little bit Pain Location:  bottom, generalized Pain Descriptors / Indicators: Discomfort, Dull Pain Intervention(s): Limited activity within patient's tolerance, Monitored during session, Repositioned   Frequency  Min 2X/week        Progress Toward Goals  OT Goals(current goals can now be found in the care plan section)  Progress towards OT goals: Progressing toward goals  Acute Rehab OT Goals OT Goal Formulation: With patient/family Time For Goal Achievement: 01/26/24 Potential to Achieve Goals: Good ADL Goals Pt Will Perform Grooming: with set-up;sitting Pt Will Perform Upper Body Bathing: with set-up;sitting Pt Will Perform Upper Body Dressing: with set-up;sitting Pt Will Transfer to Toilet: with contact guard assist;bedside commode;stand pivot transfer Additional ADL Goal #1: The pt will perform supine to sit and sit to stand with CGA, in prep for progressive ADL participation.  Plan      Co-evaluation    PT/OT/SLP Co-Evaluation/Treatment: Yes Reason for Co-Treatment: To address functional/ADL transfers;For patient/therapist safety PT goals addressed during session: Mobility/safety with mobility;Balance OT goals addressed during session: ADL's and self-care;Proper use of Adaptive equipment and DME      AM-PAC OT 6 Clicks Daily Activity     Outcome Measure   Help from another person eating meals?: None Help from another person taking care of personal grooming?: A Little Help from another person toileting, which includes using toliet, bedpan, or urinal?: A Lot Help from another person bathing (including washing, rinsing, drying)?: A Lot Help from another person to put on and taking off regular upper body clothing?: A Little Help from another person to put on and taking off regular lower body clothing?: Total 6 Click Score: 15    End of Session Equipment Utilized During Treatment: Gait belt;Rolling walker (2 wheels)  OT Visit Diagnosis: Unsteadiness on feet (R26.81);Other abnormalities  of gait and mobility (R26.89);Muscle weakness (generalized) (M62.81);History of falling (Z91.81);Pain   Activity Tolerance Patient limited by fatigue   Patient Left in bed;with call bell/phone within reach;with bed alarm set   Nurse Communication Mobility status        Time: 8547-8477 OT Time Calculation (min): 30 min  Charges: OT General Charges $OT Visit: 1 Visit OT Treatments $Self Care/Home Management : 8-22 mins  Ravinder Lukehart L. Maressa Apollo, OTR/L  01/17/2024, 4:37 PM

## 2024-01-18 ENCOUNTER — Other Ambulatory Visit: Payer: Self-pay | Admitting: Family

## 2024-01-18 ENCOUNTER — Inpatient Hospital Stay (HOSPITAL_COMMUNITY)

## 2024-01-18 DIAGNOSIS — N179 Acute kidney failure, unspecified: Secondary | ICD-10-CM | POA: Insufficient documentation

## 2024-01-18 DIAGNOSIS — F321 Major depressive disorder, single episode, moderate: Secondary | ICD-10-CM

## 2024-01-18 DIAGNOSIS — R9389 Abnormal findings on diagnostic imaging of other specified body structures: Secondary | ICD-10-CM | POA: Insufficient documentation

## 2024-01-18 DIAGNOSIS — L8990 Pressure ulcer of unspecified site, unspecified stage: Secondary | ICD-10-CM | POA: Insufficient documentation

## 2024-01-18 DIAGNOSIS — D631 Anemia in chronic kidney disease: Secondary | ICD-10-CM | POA: Insufficient documentation

## 2024-01-18 DIAGNOSIS — G9341 Metabolic encephalopathy: Secondary | ICD-10-CM | POA: Insufficient documentation

## 2024-01-18 LAB — BASIC METABOLIC PANEL WITH GFR
Anion gap: 9 (ref 5–15)
BUN: 40 mg/dL — ABNORMAL HIGH (ref 8–23)
CO2: 28 mmol/L (ref 22–32)
Calcium: 9 mg/dL (ref 8.9–10.3)
Chloride: 100 mmol/L (ref 98–111)
Creatinine, Ser: 3.13 mg/dL — ABNORMAL HIGH (ref 0.44–1.00)
GFR, Estimated: 15 mL/min — ABNORMAL LOW
Glucose, Bld: 134 mg/dL — ABNORMAL HIGH (ref 70–99)
Potassium: 5.2 mmol/L — ABNORMAL HIGH (ref 3.5–5.1)
Sodium: 136 mmol/L (ref 135–145)

## 2024-01-18 LAB — GLUCOSE, CAPILLARY
Glucose-Capillary: 128 mg/dL — ABNORMAL HIGH (ref 70–99)
Glucose-Capillary: 173 mg/dL — ABNORMAL HIGH (ref 70–99)
Glucose-Capillary: 240 mg/dL — ABNORMAL HIGH (ref 70–99)
Glucose-Capillary: 145 mg/dL — ABNORMAL HIGH (ref 70–99)

## 2024-01-18 LAB — CBC
HCT: 26.7 % — ABNORMAL LOW (ref 36.0–46.0)
Hemoglobin: 8.1 g/dL — ABNORMAL LOW (ref 12.0–15.0)
MCH: 23.9 pg — ABNORMAL LOW (ref 26.0–34.0)
MCHC: 30.3 g/dL (ref 30.0–36.0)
MCV: 78.8 fL — ABNORMAL LOW (ref 80.0–100.0)
Platelets: 281 K/uL (ref 150–400)
RBC: 3.39 MIL/uL — ABNORMAL LOW (ref 3.87–5.11)
RDW: 15.9 % — ABNORMAL HIGH (ref 11.5–15.5)
WBC: 10.4 K/uL (ref 4.0–10.5)
nRBC: 0.2 % (ref 0.0–0.2)

## 2024-01-18 MED ORDER — HYDRALAZINE HCL 25 MG PO TABS
25.0000 mg | ORAL_TABLET | Freq: Three times a day (TID) | ORAL | Status: DC
  Administered 2024-01-18 – 2024-01-19 (×5): 25 mg via ORAL
  Filled 2024-01-18 (×5): qty 1

## 2024-01-18 MED ORDER — OXYCODONE HCL 5 MG PO TABS
5.0000 mg | ORAL_TABLET | Freq: Once | ORAL | Status: AC
  Administered 2024-01-18: 5 mg via ORAL
  Filled 2024-01-18: qty 1

## 2024-01-18 NOTE — Assessment & Plan Note (Signed)
 Cr 2.8 on admission, roughly at baseline.  During hospital stay, worsened to 3.7.  Fluids started and improved   Nephrology consulted, recommended holding losartan  2 months and holding hydrochlorothiazide  for now and outpatient Follow up with Dr. Norine  - Hold losartan  and hydrochlorothiazide 

## 2024-01-18 NOTE — Assessment & Plan Note (Signed)
 Stage II pressure injury right buttocks, not present on admission

## 2024-01-18 NOTE — TOC Progression Note (Signed)
 Transition of Care [facility]) - Progression Note    Patient Details  Name: Madison Edwards MRN: [id number] Date of Birth: 03-25-1945  [facility]) CM/SW Contact  Heather DELENA Saltness, LCSW Phone Number: 01/18/2024, 2:25 PM  Clinical Narrative:    CSW met with pt and spouse, Astrid Vides. at bedside to discuss SNF bed availability and obtain pt's preferred facility. CSW provided pt and spouse with list of facilities with available beds including name of facility, location, and Medicare Star-Rating. Pt and spouse choose [facility]. [facility] at [facility] notified. Insurance authorization request submitted, currently pending. TOC will continue to follow.  Medicare Star-Ratings  [facility] [address] [place], [place] [address] [phone] Overall rating ???? Below average  [facility] [address] [place], [place] [address] [phone] Overall rating ?? Much below average  [facility] [address] [place], [place] [address] [phone] Overall rating ??? Much below average  [facility] [address] [place], [place] [address] [phone] Overall rating ?????   Expected Discharge Plan: Skilled Nursing Facility Barriers to Discharge: Continued Medical Work up, SNF Pending bed offer   Expected Discharge Plan and Services In-house Referral: Clinical Social Work Discharge Planning Services: NA Post Acute Care Choice: Skilled Nursing Facility Living arrangements for the past 2 months: Single Family Home                   Social Drivers of Health (SDOH) Interventions SDOH Screenings   Food Insecurity: Unknown (01/10/2024)  Housing: Unknown (01/10/2024)  Transportation Needs: Unknown (01/10/2024)  Utilities: Not At Risk (01/10/2024)  Depression (PHQ2-9): Low Risk (08/16/2023)  Recent Concern: Depression (PHQ2-9) -  Medium Risk (06/14/2023)  Social Connections: Unknown (01/10/2024)  Tobacco Use: Unknown (01/09/2024)    Readmission Risk Interventions    01/14/2024    1:41 PM  Readmission Risk Prevention Plan  Transportation Screening Complete  PCP or Specialist Appt within 5-7 Days Complete  Home Care Screening Complete  Medication Review (RN CM) Complete   Signed: Heather Saltness, MSW, LCSW Clinical Social Worker Inpatient Care Management 01/18/2024 3:15 PM

## 2024-01-18 NOTE — Assessment & Plan Note (Signed)
 Initial presenting complaint. Due to hypoglycemia. No cognitive impairment at baseline, at presentation was confused and poorly responsive. Now resolved.

## 2024-01-18 NOTE — Assessment & Plan Note (Signed)
 Seen on x-ray 12/30 - CT chest ordered

## 2024-01-18 NOTE — Assessment & Plan Note (Signed)
 SABRA

## 2024-01-18 NOTE — Plan of Care (Signed)

## 2024-01-18 NOTE — Assessment & Plan Note (Signed)
-   Hold tirzepatide

## 2024-01-18 NOTE — Assessment & Plan Note (Signed)
 Type 2 diabetes Now with hyperglycemia - Hold home glimepiride - Continue SS corrections

## 2024-01-18 NOTE — Assessment & Plan Note (Signed)
 Admitted and treated with 5 days Tamiflu , now completed. Still with lingering cough.

## 2024-01-18 NOTE — Assessment & Plan Note (Signed)
 BP elevated - Continue home amlodipine , carvedilol , doxazosin  - Hold losartan  hydrochlorothiazide 

## 2024-01-18 NOTE — Progress Notes (Signed)
" °  Progress Note   Patient: Madison Edwards FMW:[id number] DOB: 1945-05-10 DOA: 01/09/2024     8 DOS: the patient was seen and examined on 01/18/2024 at 8:30 a.m.      Brief hospital course: 79 y.o. F with obesity, DM, CKD IV baseline 2.7, and HTN who presented with being found unresponsive.  In the ER, CBG 33, given dextrose  and became more responsive.  Subsequently found to have influenza.  Hospitalization complicated by AKI on CKD.     Assessment and Plan: * Influenza A Resolved, mild lingering cough, weaned off oxygen  Right paratracheal soft tissue prominence Seen on x-ray 12/30 - CT chest ordered  AKI on CKD IV Has longstanding CKD 4, baseline creatinine unknown, although 1 month before admission it was 2.7.  Here creatinine trended up to 3.9, now stabilized around 3.1, likely new baseline. - Hold losartan  and hydrochlorothiazide   Pressure injury of skin Stage II pressure injury right buttocks, not present on admission  Acute metabolic encephalopathy Resolved  Essential hypertension Hypertensive urgency BP elevated -Start hydralazine  - Continue home amlodipine , carvedilol , doxazosin  - Hold losartan  hydrochlorothiazide    Hypoglycemia Type 2 diabetes Glucose better controlled this morning - Hold home glimepiride - Continue SS corrections  Morbid obesity due to excess calories (HCC) - Hold tirzepatide    Normocytic anemia Due to chronic kidney disease        Subjective: She has generalized malaise, fatigue, persistent cough, does not want to get out of bed     Physical Exam: BP (!) 167/67   Pulse 75   Temp 98.7 F (37.1 C) (Oral)   Resp 18   Wt 105.1 kg   SpO2 98%   BMI 40.38 kg/m   Obese adult female, lying in bed, appears weak and tired RRR, no murmurs, no peripheral pitting edema Respiratory rate seems normal, respiratory effort diminished, poor air movement, no rales or wheezes appreciated Abdomen without grimace to palpation Sleepy  sluggish, opens eyes and then closes them again, weakly attempts to follow exam, face symmetric, 4/5 strength in upper extremities bilaterally    Data Reviewed: Discussed with radiology Basic metabolic panel shows mild hyperkalemia, creatinine 3.1 stable CBC shows stable anemia   Family Communication: Husband at the bedside    Disposition: Status is: Inpatient         Author: Lonni SHAUNNA Dalton, MD 01/18/2024 11:12 AM  For on call review [email or web address]    "

## 2024-01-19 LAB — BASIC METABOLIC PANEL WITH GFR
Anion gap: 9 (ref 5–15)
BUN: 40 mg/dL — ABNORMAL HIGH (ref 8–23)
CO2: 27 mmol/L (ref 22–32)
Calcium: 9.2 mg/dL (ref 8.9–10.3)
Chloride: 101 mmol/L (ref 98–111)
Creatinine, Ser: 3.53 mg/dL — ABNORMAL HIGH (ref 0.44–1.00)
GFR, Estimated: 13 mL/min — ABNORMAL LOW
Glucose, Bld: 142 mg/dL — ABNORMAL HIGH (ref 70–99)
Potassium: 5.6 mmol/L — ABNORMAL HIGH (ref 3.5–5.1)
Sodium: 137 mmol/L (ref 135–145)

## 2024-01-19 LAB — GLUCOSE, CAPILLARY
Glucose-Capillary: 158 mg/dL — ABNORMAL HIGH (ref 70–99)
Glucose-Capillary: 153 mg/dL — ABNORMAL HIGH (ref 70–99)

## 2024-01-19 MED ORDER — DICLOFENAC SODIUM 1 % EX GEL: 2.0000 g | Freq: Four times a day (QID) | CUTANEOUS | Status: AC

## 2024-01-19 MED ORDER — HYDRALAZINE HCL 50 MG PO TABS: 50.0000 mg | ORAL_TABLET | Freq: Three times a day (TID) | ORAL | Status: DC

## 2024-01-19 MED ORDER — AMLODIPINE BESYLATE 10 MG PO TABS: 10.0000 mg | ORAL_TABLET | Freq: Every day | ORAL | Status: AC

## 2024-01-19 MED ORDER — INSULIN ASPART 100 UNIT/ML IJ SOLN: 0.0000 [IU] | Freq: Three times a day (TID) | INTRAMUSCULAR | Status: AC

## 2024-01-19 MED ORDER — DM-GUAIFENESIN ER 30-600 MG PO TB12: 1.0000 | ORAL_TABLET | Freq: Two times a day (BID) | ORAL | Status: AC

## 2024-01-19 NOTE — [place] FL2 (Signed)
 " [place]  MEDICAID FL2 LEVEL OF CARE FORM     IDENTIFICATION  Patient Name: Madison Edwards Birthdate: July 29, 1945 Sex: female Admission Date (Current Location): 01/09/2024  [facility] [place] Number:  Producer, Television/film/video and Address:  [facility],  [address], [place] [address]      Provider Number: [id number]  Attending Physician Name and Address:  Jonel Lonni SQUIBB, *  Relative Name and Phone Number:  [place], Cunanan  Spouse, Emergency Contact  [phone]    Current Level of Care: Hospital Recommended Level of Care: Skilled Nursing Facility Prior Approval Number:    Date Approved/Denied:   PASRR Number: [id number]  Discharge Plan: SNF    Current Diagnoses: Patient Active Problem List   Diagnosis Date Noted   Acute metabolic encephalopathy 01/18/2024   Pressure injury of skin 01/18/2024   AKI on CKD IV 01/18/2024   Right paratracheal soft tissue prominence 01/18/2024   Anemia in chronic kidney disease (CKD) 01/18/2024   Hypoglycemia 01/10/2024   Hypertensive urgency 01/10/2024   Type 2 diabetes mellitus with hypoglycemia without coma, without long-term current use of insulin  (HCC) 01/10/2024   Mixed hyperlipidemia 01/10/2024   Influenza A 01/09/2024   Current moderate episode of major depressive disorder without prior episode (HCC) 09/20/2023   Type 2 diabetes mellitus with hyperglycemia, without long-term current use of insulin  (HCC) 09/20/2023   Primary hypertension 09/20/2023   Vitamin D  deficiency 09/20/2023   CKD (chronic kidney disease) stage 4, GFR 15-29 ml/min (HCC) 06/14/2023   Morbid obesity due to excess calories (HCC) 06/14/2023    Orientation RESPIRATION BLADDER Height & Weight     Self, Time, Situation, Place  Normal Incontinent Weight: 122.7 kg Height:  5' 4 (162.6 cm)  BEHAVIORAL SYMPTOMS/MOOD NEUROLOGICAL BOWEL NUTRITION STATUS      Continent Diet (regular)  AMBULATORY STATUS COMMUNICATION OF NEEDS Skin    Extensive Assist Verbally Normal                       Personal Care Assistance Level of Assistance  Bathing, Feeding, Dressing Bathing Assistance: Maximum assistance Feeding assistance: Limited assistance Dressing Assistance: Maximum assistance     Functional Limitations Info  Sight, Hearing, Speech Sight Info: Impaired (eyeglasses) Hearing Info: Adequate Speech Info: Adequate    SPECIAL CARE FACTORS FREQUENCY  PT (By licensed PT), OT (By licensed OT)     PT Frequency: 5x/wk OT Frequency: 5x/wk            Contractures Contractures Info: Not present    Additional Factors Info  Code Status, Allergies, Psychotropic Code Status Info: FULL Allergies Info: Dust Mite Extract, Orange Juice (Orange Oil), Pollen Extract Psychotropic Info: Zoloft          Current Medications (01/19/2024):  This is the current hospital active medication list Current Facility-Administered Medications  Medication Dose Route Frequency Provider Last Rate Last Admin   acetaminophen  (TYLENOL ) tablet 650 mg  650 mg Oral Q6H PRN Amponsah, Prosper M, MD   650 mg at 01/18/24 1656   Or   acetaminophen  (TYLENOL ) suppository 650 mg  650 mg Rectal Q6H PRN Amponsah, Prosper M, MD       albuterol  (PROVENTIL ) (2.5 MG/3ML) 0.083% nebulizer solution 2.5 mg  2.5 mg Nebulization TID Will Almarie MATSU, MD   2.5 mg at 01/19/24 1403   amLODipine  (NORVASC ) tablet 10 mg  10 mg Oral Daily Mathews, Elizabeth G, MD   10 mg at 01/19/24 9147   atorvastatin  (LIPITOR) tablet  40 mg  40 mg Oral QHS Amponsah, Prosper M, MD   40 mg at 01/18/24 2150   bisacodyl  (DULCOLAX) EC tablet 5 mg  5 mg Oral Daily PRN Amponsah, Prosper M, MD       calcium  carbonate (TUMS - dosed in mg elemental calcium ) chewable tablet 200 mg of elemental calcium   1 tablet Oral Q6H PRN Mathews, Elizabeth G, MD   200 mg of elemental calcium  at 01/12/24 2118   carvedilol  (COREG ) tablet 25 mg  25 mg Oral BID WC Francesca Elsie CROME, MD   25 mg at 01/19/24  9147   dextromethorphan -guaiFENesin  (MUCINEX  DM) 30-600 MG per 12 hr tablet 1 tablet  1 tablet Oral BID Lou Claretta HERO, MD   1 tablet at 01/19/24 9147   diclofenac  Sodium (VOLTAREN ) 1 % topical gel 2 g  2 g Topical QID Amponsah, Prosper M, MD   2 g at 01/19/24 1341   doxazosin  (CARDURA ) tablet 4 mg  4 mg Oral BID Amponsah, Prosper M, MD   4 mg at 01/19/24 9147   feeding supplement (ENSURE PLUS HIGH PROTEIN) liquid 237 mL  237 mL Oral BID BM Will Almarie MATSU, MD   237 mL at 01/19/24 1341   ferrous sulfate  tablet 325 mg  325 mg Oral QODAY Amponsah, Prosper M, MD   325 mg at 01/18/24 0818   heparin  injection 5,000 Units  5,000 Units Subcutaneous Q8H Amponsah, Prosper M, MD   5,000 Units at 01/19/24 1232   hydrALAZINE  (APRESOLINE ) injection 10 mg  10 mg Intravenous Q4H PRN Mathews, Elizabeth G, MD   10 mg at 01/18/24 0534   hydrALAZINE  (APRESOLINE ) tablet 50 mg  50 mg Oral Q8H Danford, Lonni SQUIBB, MD       insulin  aspart (novoLOG ) injection 0-15 Units  0-15 Units Subcutaneous TID WC Mathews, Elizabeth G, MD   3 Units at 01/19/24 1231   latanoprost  (XALATAN ) 0.005 % ophthalmic solution 1 drop  1 drop Both Eyes QHS Amponsah, Prosper M, MD   1 drop at 01/18/24 2151   lidocaine  (LIDODERM ) 5 % 1 patch  1 patch Transdermal Once Daniels, James K, NP       menthol  (CEPACOL) lozenge 3 mg  1 lozenge Oral PRN Francesca Elsie CROME, MD       multivitamin liquid 15 mL  15 mL Oral Daily Lou Claretta HERO, MD   15 mL at 01/19/24 0852   ondansetron  (ZOFRAN ) tablet 4 mg  4 mg Oral Q6H PRN Amponsah, Prosper M, MD   4 mg at 01/14/24 1659   Or   ondansetron  (ZOFRAN ) injection 4 mg  4 mg Intravenous Q6H PRN Amponsah, Prosper M, MD   4 mg at 01/16/24 1547   pantoprazole  (PROTONIX ) EC tablet 40 mg  40 mg Oral QHS Amponsah, Prosper M, MD   40 mg at 01/18/24 2150   promethazine  (PHENERGAN ) 12.5 mg in sodium chloride  0.9 % 50 mL IVPB  12.5 mg Intravenous Q6H PRN Will Almarie MATSU, MD 150 mL/hr at 01/12/24 0426  12.5 mg at 01/12/24 0426   senna-docusate (Senokot-S) tablet 1 tablet  1 tablet Oral QHS PRN Lou Claretta HERO, MD       sertraline  (ZOLOFT ) tablet 50 mg  50 mg Oral QHS Amponsah, Prosper M, MD   50 mg at 01/18/24 2150   sodium chloride  (OCEAN) 0.65 % nasal spray 1 spray  1 spray Each Nare PRN Will Almarie MATSU, MD   1 spray at 01/15/24 0158   timolol  (TIMOPTIC )  0.25 % ophthalmic solution 1 drop  1 drop Both Eyes q AM Lou Claretta HERO, MD   1 drop at 01/19/24 0853   Vitamin D  (Ergocalciferol ) (DRISDOL ) 1.25 MG (50000 UNIT) capsule 50,000 Units  50,000 Units Oral Q7 days Will Almarie MATSU, MD   50,000 Units at 01/15/24 2153     Discharge Medications: Please see discharge summary for a list of discharge medications.  Relevant Imaging Results:  Relevant Lab Results:   Additional Information SSN: [id number]  Alfonse JONELLE Rex, RN     "

## 2024-01-19 NOTE — Plan of Care (Signed)

## 2024-01-19 NOTE — TOC Transition Note (Signed)
 Transition of Care [facility]) - Discharge Note   Patient Details  Name: Madison Edwards MRN: [id number] Date of Birth: 02-15-1945  [facility] CM/SW Contact:  Alfonse JONELLE Rex, RN Phone Number: 01/19/2024, 3:26 PM   Clinical Narrative:  DC to [facility]( RM 1204), pt's son, Ardean, notified of transfer. PTAR for transport. No further CM needs identified at this time.      Final next level of care: Skilled Nursing Facility Barriers to Discharge: Barriers Resolved   Patient Goals and CMS Choice Patient states their goals for this hospitalization and ongoing recovery are:: To get rehab          Discharge Placement              Patient chooses bed at: [facility] Patient to be transferred to facility by: PTAr Name of family member notified: Ardean (son) [phone] Patient and family notified of of transfer: 01/19/24  Discharge Plan and Services Additional resources added to the After Visit Summary for   In-house Referral: Clinical Social Work Discharge Planning Services: NA Post Acute Care Choice: Skilled Nursing Facility                               Social Drivers of Health (SDOH) Interventions SDOH Screenings   Food Insecurity: No Food Insecurity (01/19/2024)  Housing: Low Risk (01/19/2024)  Transportation Needs: No Transportation Needs (01/19/2024)  Utilities: Not At Risk (01/10/2024)  Depression (PHQ2-9): Low Risk (08/16/2023)  Recent Concern: Depression (PHQ2-9) - Medium Risk (06/14/2023)  Social Connections: Moderately Isolated (01/19/2024)  Tobacco Use: Unknown (01/09/2024)     Readmission Risk Interventions    01/14/2024    1:41 PM  Readmission Risk Prevention Plan  Transportation Screening Complete  PCP or Specialist Appt within 5-7 Days Complete  Home Care Screening Complete  Medication Review (RN CM) Complete

## 2024-01-19 NOTE — Discharge Summary (Signed)
 " Physician Discharge Summary   Patient: Madison Edwards MRN: [id number] DOB: 11/28/1945  Admit date:     01/09/2024  Discharge date: 01/19/2024  Discharge Physician: Lonni SHAUNNA Dalton   PCP: Leonarda Roxan BROCKS, NP     Recommendations at discharge:  Follow up with PCP within 1 week of discharge from SNF [facility]: Please obtain CT chest with contrast (if renal function allows, without if not) within 2 months to follow up paratracheal soft tissue prominence  [facility]: Obtain BMP and CBC in 5 days Schedule follow up with patient's nephrologist within 1 week If no primary nephrologist appointment can be made, recommend following up with [facility] Stop home glimepiride and titrate new short acting insulin  as appropriate     Discharge Diagnoses: Principal Problem:   Influenza A Active Problems:   CKD (chronic kidney disease) stage 4, GFR 15-29 ml/min (HCC)   Morbid obesity due to excess calories (HCC)   Primary hypertension   Hypoglycemia   Hypertensive urgency   Type 2 diabetes mellitus with hypoglycemia without coma, without long-term current use of insulin  (HCC)   Mixed hyperlipidemia   Acute metabolic encephalopathy   Pressure injury of skin   AKI on CKD IV   Right paratracheal soft tissue prominence   Anemia in chronic kidney disease (CKD)      Hospital Course: 79 y.o. F with obesity, DM, CKD IV baseline 2.7, and HTN who presented with being found unresponsive.  In the ER, CBG 33, given dextrose  and became more responsive.  Subsequently found to have influenza.  Hospitalization complicated by AKI on CKD.     * Influenza A Admitted and treated with 5 days Tamiflu , symptoms resolved.    Anemia in chronic kidney disease (CKD) Hgb stable at recent baseline.    Right paratracheal soft tissue prominence This was seen incidentally on admission CXR.  Radiology feel that it was present since likely 2011, but could be followed up with CT with  contrast.  If renal function does not allow CT with contrast, CT without is reasonable.     AKI on CKD IV Cr 2.8 on admission, roughly at baseline.  During hospital stay, worsened to 3.7.  Fluids started and improved   Nephrology consulted, recommended holding losartan  2 months and holding hydrochlorothiazide  for now and outpatient Follow up with Nephrology - Hold losartan  and hydrochlorothiazide    Pressure injury of skin Stage II pressure injury right buttocks, not present on admission  Acute metabolic encephalopathy Initial presenting complaint. Due to hypoglycemia. No cognitive impairment at baseline, at presentation was confused and poorly responsive. Now resolved.   Hypertensive urgency Amlodipine , carvedilol  and doxazosin  were continued.  Losartan  hydrochlorothiazide  stopped.  Hydralazine  added.     Hypoglycemia on admission Type 2 diabetes Glimepiride led to hypoglycemia due to worsening renal function.    Recommend stopping at discharge, either no medication or low dose SS short acting insulin .  Titrate at [facility] or with PCP.     Morbid obesity due to excess calories (HCC)            The [place]  Controlled Substances Registry was reviewed for this patient prior to discharge.  Consultants: Nephrology   Disposition: Skilled nursing facility Diet recommendation:  Carb modified diet  DISCHARGE MEDICATION: Allergies as of 01/19/2024       Reactions   Hydrochlorothiazide  Other (See Comments)   Possibly causing severe hyponatremia in dec 2025   Dust Mite Extract Other (See Comments)   Sneezing and Headaches  Orange Juice [orange Oil] Nausea Only, Other (See Comments)   Not tolerated in a concentrated form, but CAN eat oranges   Pollen Extract Other (See Comments)   Sneezing and Headaches        Medication List     STOP taking these medications    aspirin EC 325 MG tablet   FreeStyle Libre 3 Sensor Misc   glimepiride 4 MG tablet Commonly  known as: AMARYL   losartan -hydrochlorothiazide  100-25 MG tablet Commonly known as: HYZAAR   NyQuil Cough DM + Congestion 5-6.25-10 MG/15ML Liqd Generic drug: Phenylephrine-Doxylamine-DM   PriLOSEC OTC 20 MG tablet Generic drug: omeprazole   tirzepatide  2.5 MG/0.5ML injection vial Commonly known as: ZEPBOUND    Tylenol  325 MG tablet Generic drug: acetaminophen    Tylenol  8 Hour Arthritis Pain 650 MG CR tablet Generic drug: acetaminophen        TAKE these medications    amLODipine  10 MG tablet Commonly known as: NORVASC  Take 1 tablet (10 mg total) by mouth daily. Start taking on: January 20, 2024 What changed:  medication strength how much to take   atorvastatin  40 MG tablet Commonly known as: LIPITOR Take 40 mg by mouth at bedtime.   b complex vitamins capsule Take 1 capsule by mouth daily.   Biotin 5 MG Tabs Take 5 mg by mouth 2 (two) times a week.   carvedilol  25 MG tablet Commonly known as: COREG  Take 25 mg by mouth in the morning and at bedtime.   dextromethorphan -guaiFENesin  30-600 MG 12hr tablet Commonly known as: MUCINEX  DM Take 1 tablet by mouth 2 (two) times daily.   diclofenac  Sodium 1 % Gel Commonly known as: VOLTAREN  Apply 2 g topically 4 (four) times daily.   doxazosin  4 MG tablet Commonly known as: CARDURA  Take 4 mg by mouth in the morning and at bedtime.   ferrous sulfate  325 (65 FE) MG tablet Take 325 mg by mouth See admin instructions. Take 325 mg by mouth with breakfast EVERY OTHER MORNING   fluticasone  50 MCG/ACT nasal spray Commonly known as: FLONASE  PLACE 2 SPRAYS INTO BOTH NOSTRILS DAILY AS NEEDED FOR ALLERGIES OR RHINITIS (ALLERGIES).   insulin  aspart 100 UNIT/ML injection Commonly known as: novoLOG  Inject 0-15 Units into the skin 3 (three) times daily with meals.   latanoprost  0.005 % ophthalmic solution Commonly known as: XALATAN  Place 1 drop into both eyes at bedtime.   MULTIVITAMIN & MINERAL PO Take 1 tablet by mouth  daily with breakfast.   pantoprazole  40 MG tablet Commonly known as: PROTONIX  Take 1 tablet (40 mg total) by mouth daily. What changed: when to take this   sertraline  50 MG tablet Commonly known as: ZOLOFT  Take 1 tablet (50 mg total) by mouth daily. What changed: when to take this   timolol  0.25 % ophthalmic solution Commonly known as: TIMOPTIC  Place 1 drop into both eyes in the morning.   Vitamin D  (Ergocalciferol ) 1.25 MG (50000 UNIT) Caps capsule Commonly known as: DRISDOL  TAKE 1 CAPSULE (50,000 UNITS TOTAL) BY MOUTH EVERY 7 (SEVEN) DAYS What changed: when to take this   vitamin E 1000 UNIT capsule Take 1,000 Units by mouth daily. What changed:  when to take this additional instructions               Discharge Care Instructions  (From admission, onward)           Start     Ordered   01/19/24 0000  Discharge wound care:       Comments:  Cover with foam barrier pad Change pad every three days   01/19/24 1320            Contact information for follow-up providers     Nephrology. Schedule an appointment as soon as possible for a visit in 1 week(s).               Contact information for after-discharge care     Destination     [facility] [place], [place] .   Service: Skilled Nursing Contact information: [address] [place]  [address] [phone]                     Discharge Instructions     Discharge wound care:   Complete by: As directed    Cover with foam barrier pad Change pad every three days   Increase activity slowly   Complete by: As directed        Discharge Exam: Filed Weights   01/16/24 0500 01/18/24 0500 01/19/24 0520  Weight: 99.5 kg 105.1 kg 122.7 kg    General: Pt is alert, awake, not in acute distress, tired Cardiovascular: RRR, nl S1-S2, no murmurs appreciated.   No LE edema.   Respiratory: Normal respiratory rate and rhythm.  CTAB without rales or wheezes. Abdominal:  Abdomen soft and non-tender.  No distension or HSM.   Neuro/Psych: Strength symmetric in upper and lower extremities but very weak.  Judgment and insight appear normal.   Condition at discharge: fair  The results of significant diagnostics from this hospitalization (including imaging, microbiology, ancillary and laboratory) are listed below for reference.   Imaging Studies: DG Chest 2 View Result Date: 01/18/2024 EXAM: 2 VIEW(S) XRAY OF THE CHEST 01/18/2024 12:49:00 PM COMPARISON: 01/15/2024 CLINICAL HISTORY: Abnormal chest x-ray FINDINGS: LUNGS AND PLEURA: Improved aeration of the left lung base. Elevation of the left hemidiaphragm. No focal pulmonary opacity. No pleural effusion. No pneumothorax. HEART AND MEDIASTINUM: Cardiomegaly, unchanged. Aortic arch calcifications. Similar prominence of the right paratracheal soft tissues , which has likely been present since 2011. BONES AND SOFT TISSUES: Multilevel degenerative changes of thoracic spine. No acute osseous abnormality. IMPRESSION: 1. Cardiomegaly, unchanged. No acute cardiopulmonary abnormality. 2. Redemonstrated prominence of the right paratracheal soft tissues , which has in retrospect likely been present since 2011. it may appear more prominent on recent exams due to lower lung volumes and patient positioning. However, in order to exclude underlying lymphadenopathy, an nonemergent chest CT with IV contrast should be considered. Electronically signed by: Rogelia Myers MD MD 01/18/2024 06:13 PM EST RP Workstation: [id number]   DG CHEST PORT 1 VIEW Result Date: 01/15/2024 EXAM: 1 VIEW(S) XRAY OF THE CHEST 01/15/2024 10:50:00 AM COMPARISON: 01/11/2024 CLINICAL HISTORY: Follow-up exam FINDINGS: LUNGS AND PLEURA: No focal pulmonary opacity. No vascular congestion or edema. Probable small left pleural effusion with basilar atelectasis. No pneumothorax. Similar appearance to previous study. HEART AND MEDIASTINUM: No acute abnormality of the cardiac and  mediastinal silhouettes. Calcification of the aorta. BONES AND SOFT TISSUES: No acute osseous abnormality. IMPRESSION: 1. Small left pleural effusion with basilar atelectasis, similar to previous study. Electronically signed by: Elsie Gravely MD 01/15/2024 08:30 PM EST RP Workstation: [id number]   US  RENAL Result Date: 01/11/2024 CLINICAL DATA:  Acute kidney injury. EXAM: RENAL / URINARY TRACT ULTRASOUND COMPLETE COMPARISON:  11/03/2022 FINDINGS: Right Kidney: Renal measurements: 11.1 x 4.4 x 4.6 cm = volume: 117 mL. Normal parenchymal echogenicity. No hydronephrosis. No visualized stone or focal lesion. Left Kidney: Renal  measurements: 11.4 x 5.6 x 6.1 cm = volume: 205 mL. Question of mild increased parenchymal echogenicity. No hydronephrosis. No visualized stone or focal lesion. Bladder: Partially distended. Appears normal for degree of bladder distention. Other: Technically limited exam due to difficulty with positioning. IMPRESSION: 1. No hydronephrosis. 2. Questionable increased left renal parenchymal echogenicity suggesting chronic medical renal disease. Electronically Signed   By: Andrea Gasman M.D.   On: 01/11/2024 16:36   DG Chest 1 View Result Date: 01/11/2024 CLINICAL DATA:  Pleural effusion. EXAM: CHEST  1 VIEW COMPARISON:  01/09/2024 FINDINGS: Stable cardiomegaly. Unchanged mediastinal contours. Aortic atherosclerosis. Stable right paratracheal soft tissue density. Subsegmental opacity at the left lung base. No pleural effusion. No pulmonary edema. No pneumothorax. No acute osseous findings. IMPRESSION: 1. Subsegmental opacity at the left lung base, favor atelectasis. 2. Stable cardiomegaly. 3. No pleural effusion demonstrated on portable AP view. Electronically Signed   By: Andrea Gasman M.D.   On: 01/11/2024 16:34   DG Chest Port 1 View Result Date: 01/09/2024 EXAM: 1 VIEW(S) XRAY OF THE CHEST 01/09/2024 05:20:00 PM COMPARISON: 12/03/1999 CLINICAL HISTORY: weakness FINDINGS: LUNGS AND  PLEURA: No focal pulmonary opacity. No pleural effusion. No pneumothorax. HEART AND MEDIASTINUM: Tortuous thoracic aorta. Aortic arch calcifications. There is some soft tissue prominence in the upper right paratracheal region. Cardiac silhouette is within normal limits. BONES AND SOFT TISSUES: No acute osseous abnormality. IMPRESSION: 1. No acute cardiopulmonary abnormality. 2. Tortuous thoracic aorta with aortic arch atherosclerotic calcification. 3. Right paratracheal soft tissue prominence, which can be further evaluated with chest CT if not previously characterized. Electronically signed by: Greig Pique MD 01/09/2024 06:24 PM EST RP Workstation: [id number]    Microbiology: Results for orders placed or performed during the hospital encounter of 01/09/24  Resp panel by RT-PCR (RSV, Flu A&B, Covid) Anterior Nasal Swab     Status: Abnormal   Collection Time: 01/09/24  4:51 PM   Specimen: Anterior Nasal Swab  Result Value Ref Range Status   SARS Coronavirus 2 by RT PCR NEGATIVE NEGATIVE Final    Comment: (NOTE) SARS-CoV-2 target nucleic acids are NOT DETECTED.  The SARS-CoV-2 RNA is generally detectable in upper respiratory specimens during the acute phase of infection. The lowest concentration of SARS-CoV-2 viral copies this assay can detect is 138 copies/mL. A negative result does not preclude SARS-Cov-2 infection and should not be used as the sole basis for treatment or other patient management decisions. A negative result may occur with  improper specimen collection/handling, submission of specimen other than nasopharyngeal swab, presence of viral mutation(s) within the areas targeted by this assay, and inadequate number of viral copies(<138 copies/mL). A negative result must be combined with clinical observations, patient history, and epidemiological information. The expected result is Negative.  Fact Sheet for Patients:  bloggercourse.com  Fact Sheet for  Healthcare Providers:  seriousbroker.it  This test is no t yet approved or cleared by the [place]  FDA and  has been authorized for detection and/or diagnosis of SARS-CoV-2 by FDA under an Emergency Use Authorization (EUA). This EUA will remain  in effect (meaning this test can be used) for the duration of the COVID-19 declaration under Section 564(b)(1) of the Act, 21 U.S.C.section 360bbb-3(b)(1), unless the authorization is terminated  or revoked sooner.       Influenza A by PCR POSITIVE (A) NEGATIVE Final   Influenza B by PCR NEGATIVE NEGATIVE Final    Comment: (NOTE) The Xpert Xpress SARS-CoV-2/FLU/RSV plus assay is intended as an aid in  the diagnosis of influenza from Nasopharyngeal swab specimens and should not be used as a sole basis for treatment. Nasal washings and aspirates are unacceptable for Xpert Xpress SARS-CoV-2/FLU/RSV testing.  Fact Sheet for Patients: bloggercourse.com  Fact Sheet for Healthcare Providers: seriousbroker.it  This test is not yet approved or cleared by the [place]  FDA and has been authorized for detection and/or diagnosis of SARS-CoV-2 by FDA under an Emergency Use Authorization (EUA). This EUA will remain in effect (meaning this test can be used) for the duration of the COVID-19 declaration under Section 564(b)(1) of the Act, 21 U.S.C. section 360bbb-3(b)(1), unless the authorization is terminated or revoked.     Resp Syncytial Virus by PCR NEGATIVE NEGATIVE Final    Comment: (NOTE) Fact Sheet for Patients: bloggercourse.com  Fact Sheet for Healthcare Providers: seriousbroker.it  This test is not yet approved or cleared by the [place]  FDA and has been authorized for detection and/or diagnosis of SARS-CoV-2 by FDA under an Emergency Use Authorization (EUA). This EUA will remain in effect (meaning  this test can be used) for the duration of the COVID-19 declaration under Section 564(b)(1) of the Act, 21 U.S.C. section 360bbb-3(b)(1), unless the authorization is terminated or revoked.  Performed at [facility], [address], [place], [place] [address]   Culture, blood (Routine X 2) w Reflex to ID Panel     Status: None   Collection Time: 01/10/24  8:33 AM   Specimen: BLOOD  Result Value Ref Range Status   Specimen Description   Final    BLOOD SITE NOT SPECIFIED Performed at [facility], [address], [place], [place] [address]    Special Requests   Final    BOTTLES DRAWN AEROBIC AND ANAEROBIC Blood Culture adequate volume Performed at [facility], [address], [place], [place] [address]    Culture   Final    NO GROWTH 5 DAYS Performed at [facility], [address], [place], [place] [address]    Report Status 01/15/2024 FINAL  Final  Culture, blood (Routine X 2) w Reflex to ID Panel     Status: None   Collection Time: 01/10/24  7:09 PM   Specimen: BLOOD RIGHT HAND  Result Value Ref Range Status   Specimen Description   Final    BLOOD RIGHT HAND Performed at [facility], [address] [place], [place] [address]    Special Requests   Final    BOTTLES DRAWN AEROBIC AND ANAEROBIC Blood Culture adequate volume Performed at [facility], [address], [place], [place] [address]    Culture   Final    NO GROWTH 5 DAYS Performed at [facility], [address], [place], [place] [address]    Report Status 01/16/2024 FINAL  Final    Labs: CBC: Recent Labs  Lab 01/13/24 1154 01/14/24 0606 01/18/24 0629  WBC 6.9 7.4 10.4  HGB 9.0* 8.5* 8.1*  HCT 28.7* 26.3* 26.7*  MCV 75.7* 75.8* 78.8*  PLT 163 155 281   Basic Metabolic Panel: Recent Labs  Lab 01/15/24 0530 01/16/24 0543 01/17/24 0603 01/18/24 0629 01/19/24 0631  NA 134* 136 135 136 137  K 4.5 4.4 5.0  5.2* 5.6*  CL 97* 98 99 100 101  CO2 27 28 27 28 27   GLUCOSE 147* 125* 155* 134* 142*  BUN 56* 46* 43* 40* 40*  CREATININE 3.69* 3.17* 3.22* 3.13* 3.53*  CALCIUM  8.3* 9.2  8.9 9.0 9.2   Liver Function Tests: No results for input(s): AST, ALT, ALKPHOS, BILITOT, PROT, ALBUMIN in the last 168 hours. CBG: Recent Labs  Lab 01/18/24 1211 01/18/24 1719 01/18/24 2131 01/19/24 0802 01/19/24 1131  GLUCAP 173* 240* 145* 158* 153*    Discharge time spent: approximately 45 minutes spent on discharge counseling, evaluation of patient on day of discharge, and coordination of discharge planning with nursing, social work, pharmacy and case management  Signed: Lonni SHAUNNA Dalton, MD [facility] 01/19/2024         "

## 2024-01-19 NOTE — TOC Progression Note (Addendum)
 Transition of Care [facility]) - Progression Note    Patient Details  Name: Madison Edwards MRN: [id number] Date of Birth: 1945/06/29  [facility]) CM/SW Contact  Alfonse JONELLE Rex, RN Phone Number: 01/19/2024, 12:20 PM  Clinical Narrative:   Per [facility], SNF auth approved. Plan Auth ID [id number], Auth ID [id number], days approved 01/18/2024-01/22/2024. Team notified, MD confirmed patient medically ready for dc. MD met with family at bedside, family request to speak with NCM.  NCM spoke with patient's son Ardean, he states family would like to change SNF to [facility]. NCM called to [facility] and rehab, [place] in admitting, [facility] made SNF bed offer, states bed is available today. NCM called to [facility], sw [place], requested SNF change to [facility]. Amy states will need to update in [facility], will be assinged a new Auth#. , days approved will stay the same   -2:56pm New SNF auth for [facility] ID [id number], days approved  01/18/2024-01/22/2024       Expected Discharge Plan: Skilled Nursing Facility Barriers to Discharge: Continued Medical Work up, SNF Pending bed offer               Expected Discharge Plan and Services In-house Referral: Clinical Social Work Discharge Planning Services: NA Post Acute Care Choice: Skilled Nursing Facility Living arrangements for the past 2 months: Single Family Home                                       Social Drivers of Health (SDOH) Interventions SDOH Screenings   Food Insecurity: No Food Insecurity (01/19/2024)  Housing: Low Risk (01/19/2024)  Transportation Needs: No Transportation Needs (01/19/2024)  Utilities: Not At Risk (01/10/2024)  Depression (PHQ2-9): Low Risk (08/16/2023)  Recent Concern: Depression (PHQ2-9) - Medium Risk (06/14/2023)  Social Connections: Moderately Isolated (01/19/2024)  Tobacco Use: Unknown (01/09/2024)    Readmission Risk Interventions    01/14/2024    1:41 PM  Readmission Risk  Prevention Plan  Transportation Screening Complete  PCP or Specialist Appt within 5-7 Days Complete  Home Care Screening Complete  Medication Review (RN CM) Complete

## 2024-01-19 NOTE — Progress Notes (Signed)
" °  Progress Note   Patient: Madison Edwards FMW:[id number] DOB: [birth year] DOA: 01/09/2024     9 DOS: the patient was seen and examined on 01/19/2024 at 11:07AM      Brief hospital course: 79 y.o. F with obesity, DM, CKD IV baseline 2.7, and HTN who presented with being found unresponsive.  In the ER, CBG 33, given dextrose  and became more responsive.  Subsequently found to have influenza.  Hospitalization complicated by AKI on CKD.     Assessment and Plan: * Influenza A Resolved  Anemia in chronic kidney disease (CKD) Stable  Right paratracheal soft tissue prominence -CT chest  AKI on CKD IV Hyperkalemia Her creatinine has trended up slightly.  Discussed with nephrology, they recommend continuing current care, outpatient follow-up  - Hold losartan  and hydrochlorothiazide   Pressure injury of skin Stage II pressure injury right buttocks, not present on admission  Acute metabolic encephalopathy Resolved  Hypertensive urgency BP elevated - Continue amlodipine , doxazosin , new hydralazine , titrate up the latter - Hold losartan  hydrochlorothiazide    Hypoglycemia Type 2 diabetes Glucose control - Hold home glimepiride - Continue SS corrections  Morbid obesity due to excess calories (HCC) - Hold tirzepatide            Subjective: Patient has pain on her new buttocks ulcers.  She has had no fever, no confusion.  She is generally weak, she does not want to get out of bed.  She is very tired.  Her appetite is poor.     Physical Exam: BP (!) 158/66 (BP Location: Right Arm)   Pulse 72   Temp 98.1 F (36.7 C)   Resp 17   Ht 5' 4 (1.626 m)   Wt 122.7 kg   SpO2 100%   BMI 46.43 kg/m   Elderly adult female, obese, lying in bed, later sitting on the commode RRR, no murmurs, no peripheral edema Respiratory rate normal, lungs clear without rales or wheezes Abdomen soft, no tenderness to palpation or guarding There multiple very small shallow ulcerations on the  lower buttocks and inner thighs with no surrounding cellulitis Amblyopia is unchanged, face metric, speech slow but at baseline, oriented x 3, just generalized weakness    Data Reviewed: Discussed with nephrology Basic metabolic panel shows creatinine 3.5, potassium slightly up CBC shows stable anemia    Family Communication:     Disposition: Status is: Inpatient         Author: Lonni SHAUNNA Dalton, MD 01/19/2024 12:58 PM  For on call review [email or web address].    "

## 2024-01-24 ENCOUNTER — Encounter: Payer: Self-pay | Admitting: Family

## 2024-02-02 ENCOUNTER — Other Ambulatory Visit: Payer: Self-pay | Admitting: Family

## 2024-02-02 DIAGNOSIS — F321 Major depressive disorder, single episode, moderate: Secondary | ICD-10-CM

## 2024-02-04 NOTE — Progress Notes (Signed)
   This encounter was created in error - please disregard. No show
